# Patient Record
Sex: Female | Born: 1970 | Race: White | Hispanic: No | Marital: Married | Smoking: Never smoker
Health system: Southern US, Community
[De-identification: ages and names within clinical notes are randomized; demographics above are authoritative.]

## PROBLEM LIST (undated history)

## (undated) DIAGNOSIS — M069 Rheumatoid arthritis, unspecified: Secondary | ICD-10-CM

## (undated) DIAGNOSIS — T7840XA Allergy, unspecified, initial encounter: Secondary | ICD-10-CM

## (undated) DIAGNOSIS — K5190 Ulcerative colitis, unspecified, without complications: Secondary | ICD-10-CM

## (undated) DIAGNOSIS — M797 Fibromyalgia: Secondary | ICD-10-CM

## (undated) DIAGNOSIS — I10 Essential (primary) hypertension: Secondary | ICD-10-CM

## (undated) DIAGNOSIS — J45909 Unspecified asthma, uncomplicated: Secondary | ICD-10-CM

## (undated) DIAGNOSIS — F4310 Post-traumatic stress disorder, unspecified: Secondary | ICD-10-CM

## (undated) HISTORY — DX: Essential (primary) hypertension: I10

## (undated) HISTORY — DX: Post-traumatic stress disorder, unspecified: F43.10

## (undated) HISTORY — PX: TONSILLECTOMY: SUR1361

## (undated) HISTORY — DX: Ulcerative colitis, unspecified, without complications: K51.90

## (undated) HISTORY — DX: Allergy, unspecified, initial encounter: T78.40XA

## (undated) HISTORY — DX: Rheumatoid arthritis, unspecified: M06.9

## (undated) HISTORY — DX: Fibromyalgia: M79.7

## (undated) HISTORY — DX: Unspecified asthma, uncomplicated: J45.909

---

## 2014-01-30 DIAGNOSIS — R011 Cardiac murmur, unspecified: Secondary | ICD-10-CM | POA: Insufficient documentation

## 2014-01-30 DIAGNOSIS — M069 Rheumatoid arthritis, unspecified: Secondary | ICD-10-CM | POA: Insufficient documentation

## 2014-01-30 DIAGNOSIS — Z9109 Other allergy status, other than to drugs and biological substances: Secondary | ICD-10-CM | POA: Insufficient documentation

## 2014-01-30 DIAGNOSIS — Z8659 Personal history of other mental and behavioral disorders: Secondary | ICD-10-CM | POA: Insufficient documentation

## 2014-01-30 DIAGNOSIS — M797 Fibromyalgia: Secondary | ICD-10-CM | POA: Insufficient documentation

## 2014-04-23 DIAGNOSIS — E559 Vitamin D deficiency, unspecified: Secondary | ICD-10-CM | POA: Insufficient documentation

## 2014-04-27 DIAGNOSIS — M7918 Myalgia, other site: Secondary | ICD-10-CM | POA: Insufficient documentation

## 2014-04-27 DIAGNOSIS — M5417 Radiculopathy, lumbosacral region: Secondary | ICD-10-CM | POA: Insufficient documentation

## 2014-04-27 DIAGNOSIS — Z5181 Encounter for therapeutic drug level monitoring: Secondary | ICD-10-CM | POA: Insufficient documentation

## 2014-04-27 DIAGNOSIS — G894 Chronic pain syndrome: Secondary | ICD-10-CM | POA: Insufficient documentation

## 2015-04-29 DIAGNOSIS — I10 Essential (primary) hypertension: Secondary | ICD-10-CM | POA: Insufficient documentation

## 2016-04-13 DIAGNOSIS — G43009 Migraine without aura, not intractable, without status migrainosus: Secondary | ICD-10-CM | POA: Insufficient documentation

## 2016-05-11 DIAGNOSIS — F339 Major depressive disorder, recurrent, unspecified: Secondary | ICD-10-CM | POA: Insufficient documentation

## 2017-04-30 DIAGNOSIS — F410 Panic disorder [episodic paroxysmal anxiety] without agoraphobia: Secondary | ICD-10-CM | POA: Insufficient documentation

## 2017-05-24 DIAGNOSIS — M170 Bilateral primary osteoarthritis of knee: Secondary | ICD-10-CM | POA: Insufficient documentation

## 2017-12-19 DIAGNOSIS — M19012 Primary osteoarthritis, left shoulder: Secondary | ICD-10-CM | POA: Insufficient documentation

## 2018-11-25 DIAGNOSIS — K5190 Ulcerative colitis, unspecified, without complications: Secondary | ICD-10-CM | POA: Insufficient documentation

## 2019-10-08 ENCOUNTER — Other Ambulatory Visit: Payer: Self-pay

## 2019-10-09 ENCOUNTER — Encounter: Payer: Managed Care, Other (non HMO) | Admitting: Obstetrics & Gynecology

## 2019-10-14 ENCOUNTER — Encounter: Payer: Managed Care, Other (non HMO) | Admitting: Obstetrics and Gynecology

## 2019-10-31 ENCOUNTER — Other Ambulatory Visit (HOSPITAL_COMMUNITY)
Admission: RE | Admit: 2019-10-31 | Discharge: 2019-10-31 | Disposition: A | Discharge status: Home / Self Care | Payer: Managed Care, Other (non HMO) | Source: Ambulatory Visit | Attending: Obstetrics & Gynecology | Admitting: Obstetrics & Gynecology

## 2019-10-31 ENCOUNTER — Other Ambulatory Visit: Payer: Self-pay

## 2019-10-31 ENCOUNTER — Encounter: Payer: Self-pay | Admitting: Obstetrics and Gynecology

## 2019-10-31 ENCOUNTER — Ambulatory Visit (INDEPENDENT_AMBULATORY_CARE_PROVIDER_SITE_OTHER): Payer: Managed Care, Other (non HMO) | Admitting: Obstetrics and Gynecology

## 2019-10-31 ENCOUNTER — Encounter: Payer: Managed Care, Other (non HMO) | Admitting: Obstetrics and Gynecology

## 2019-10-31 VITALS — BP 128/80 | HR 68 | Temp 97.3°F | Ht 62.0 in | Wt 294.4 lb

## 2019-10-31 DIAGNOSIS — Z124 Encounter for screening for malignant neoplasm of cervix: Secondary | ICD-10-CM | POA: Insufficient documentation

## 2019-10-31 DIAGNOSIS — N920 Excessive and frequent menstruation with regular cycle: Secondary | ICD-10-CM

## 2019-10-31 DIAGNOSIS — R6882 Decreased libido: Secondary | ICD-10-CM | POA: Diagnosis not present

## 2019-10-31 DIAGNOSIS — IMO0002 Reserved for concepts with insufficient information to code with codable children: Secondary | ICD-10-CM

## 2019-10-31 DIAGNOSIS — F5231 Female orgasmic disorder: Secondary | ICD-10-CM | POA: Diagnosis not present

## 2019-10-31 DIAGNOSIS — Z01419 Encounter for gynecological examination (general) (routine) without abnormal findings: Secondary | ICD-10-CM

## 2019-10-31 DIAGNOSIS — Z62810 Personal history of physical and sexual abuse in childhood: Secondary | ICD-10-CM

## 2019-10-31 MED ORDER — SILDENAFIL CITRATE 50 MG PO TABS: ORAL_TABLET | ORAL | 0 refills | Status: DC
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
## 2019-10-31 NOTE — Addendum Note (Signed)
Addended by: Dorothy Spark on: 10/31/2019 05:08 PM   Modules accepted: Orders

## 2019-10-31 NOTE — Patient Instructions (Signed)
EXERCISE AND DIET:  We recommended that you start or continue a regular exercise program for good health. Regular exercise means any activity that makes your heart beat faster and makes you sweat.  We recommend exercising at least 30 minutes per day at least 3 days a week, preferably 4 or 5.  We also recommend a diet low in fat and sugar.  Inactivity, poor dietary choices and obesity can cause diabetes, heart attack, stroke, and kidney damage, among others.    ALCOHOL AND SMOKING:  Women should limit their alcohol intake to no more than 7 drinks/beers/glasses of wine (combined, not each!) per week. Moderation of alcohol intake to this level decreases your risk of breast cancer and liver damage. And of course, no recreational drugs are part of a healthy lifestyle.  And absolutely no smoking or even second hand smoke. Most people know smoking can cause heart and lung diseases, but did you know it also contributes to weakening of your bones? Aging of your skin?  Yellowing of your teeth and nails?  CALCIUM AND VITAMIN D:  Adequate intake of calcium and Vitamin D are recommended.  The recommendations for exact amounts of these supplements seem to change often, but generally speaking 1,000 mg of calcium (between diet and supplement) and 800 units of Vitamin D per day seems prudent. Certain women may benefit from higher intake of Vitamin D.  If you are among these women, your doctor will have told you during your visit.    PAP SMEARS:  Pap smears, to check for cervical cancer or precancers,  have traditionally been done yearly, although recent scientific advances have shown that most women can have pap smears less often.  However, every woman still should have a physical exam from her gynecologist every year. It will include a breast check, inspection of the vulva and vagina to check for abnormal growths or skin changes, a visual exam of the cervix, and then an exam to evaluate the size and shape of the uterus and  ovaries.  And after 47 years of age, a rectal exam is indicated to check for rectal cancers. We will also provide age appropriate advice regarding health maintenance, like when you should have certain vaccines, screening for sexually transmitted diseases, bone density testing, colonoscopy, mammograms, etc.   MAMMOGRAMS:  All women over 19 years old should have a yearly mammogram. Many facilities now offer a "3D" mammogram, which may cost around $50 extra out of pocket. If possible,  we recommend you accept the option to have the 3D mammogram performed.  It both reduces the number of women who will be called back for extra views which then turn out to be normal, and it is better than the routine mammogram at detecting truly abnormal areas.    COLON CANCER SCREENING: Now recommend starting at age 47. At this time colonoscopy is not covered for routine screening until 50. There are take home tests that can be done between 45-49.   COLONOSCOPY:  Colonoscopy to screen for colon cancer is recommended for all women at age 65.  We know, you hate the idea of the prep.  We agree, BUT, having colon cancer and not knowing it is worse!!  Colon cancer so often starts as a polyp that can be seen and removed at colonscopy, which can quite literally save your life!  And if your first colonoscopy is normal and you have no family history of colon cancer, most women don't have to have it again for  10 years.  Once every ten years, you can do something that may end up saving your life, right?  We will be happy to help you get it scheduled when you are ready.  Be sure to check your insurance coverage so you understand how much it will cost.  It may be covered as a preventative service at no cost, but you should check your particular policy.      Breast Self-Awareness Breast self-awareness means being familiar with how your breasts look and feel. It involves checking your breasts regularly and reporting any changes to your  health care provider. Practicing breast self-awareness is important. A change in your breasts can be a sign of a serious medical problem. Being familiar with how your breasts look and feel allows you to find any problems early, when treatment is more likely to be successful. All women should practice breast self-awareness, including women who have had breast implants. How to do a breast self-exam One way to learn what is normal for your breasts and whether your breasts are changing is to do a breast self-exam. To do a breast self-exam: Look for Changes  1. Remove all the clothing above your waist. 2. Stand in front of a mirror in a room with good lighting. 3. Put your hands on your hips. 4. Push your hands firmly downward. 5. Compare your breasts in the mirror. Look for differences between them (asymmetry), such as: ? Differences in shape. ? Differences in size. ? Puckers, dips, and bumps in one breast and not the other. 6. Look at each breast for changes in your skin, such as: ? Redness. ? Scaly areas. 7. Look for changes in your nipples, such as: ? Discharge. ? Bleeding. ? Dimpling. ? Redness. ? A change in position. Feel for Changes Carefully feel your breasts for lumps and changes. It is best to do this while lying on your back on the floor and again while sitting or standing in the shower or tub with soapy water on your skin. Feel each breast in the following way:  Place the arm on the side of the breast you are examining above your head.  Feel your breast with the other hand.  Start in the nipple area and make  inch (2 cm) overlapping circles to feel your breast. Use the pads of your three middle fingers to do this. Apply light pressure, then medium pressure, then firm pressure. The light pressure will allow you to feel the tissue closest to the skin. The medium pressure will allow you to feel the tissue that is a little deeper. The firm pressure will allow you to feel the tissue  close to the ribs.  Continue the overlapping circles, moving downward over the breast until you feel your ribs below your breast.  Move one finger-width toward the center of the body. Continue to use the  inch (2 cm) overlapping circles to feel your breast as you move slowly up toward your collarbone.  Continue the up and down exam using all three pressures until you reach your armpit.  Write Down What You Find  Write down what is normal for each breast and any changes that you find. Keep a written record with breast changes or normal findings for each breast. By writing this information down, you do not need to depend only on memory for size, tenderness, or location. Write down where you are in your menstrual cycle, if you are still menstruating. If you are having trouble noticing differences   in your breasts, do not get discouraged. With time you will become more familiar with the variations in your breasts and more comfortable with the exam. How often should I examine my breasts? Examine your breasts every month. If you are breastfeeding, the best time to examine your breasts is after a feeding or after using a breast pump. If you menstruate, the best time to examine your breasts is 5-7 days after your period is over. During your period, your breasts are lumpier, and it may be more difficult to notice changes. When should I see my health care provider? See your health care provider if you notice:  A change in shape or size of your breasts or nipples.  A change in the skin of your breast or nipples, such as a reddened or scaly area.  Unusual discharge from your nipples.  A lump or thick area that was not there before.  Pain in your breasts.  Anything that concerns you.  

## 2019-11-04 LAB — CYTOLOGY - PAP
Comment: NEGATIVE
Diagnosis: NEGATIVE
High risk HPV: NEGATIVE

## 2019-11-05 ENCOUNTER — Telehealth: Payer: Self-pay | Admitting: Obstetrics and Gynecology

## 2019-11-05 NOTE — Telephone Encounter (Signed)
Spoke with patient in regards to benefit for recommended ultrasound. Patient acknowledges understanding of information presented. Patient is scheduled 11/18/2019 with Dr Talbert Nan. Patient is aware of the appointment date arrival time and cancellation policy.  Patient states her spouse was diagnosed with covid on 11/03/2019. Forwarding to Triage to review quarantine guidelines with appointment date  Forwarding to Triage Nurse

## 2019-11-06 LAB — TESTT+TESTF+SHBG
Sex Hormone Binding: 48.9 nmol/L (ref 24.6–122.0)
Testosterone, Free: 1.6 pg/mL (ref 0.0–4.2)
Testosterone, Total, LC/MS: 21.5 ng/dL

## 2019-11-07 NOTE — Telephone Encounter (Signed)
Left message to call Delson Dulworth, RN at [facility] [phone].   

## 2019-11-10 ENCOUNTER — Telehealth: Payer: Self-pay | Admitting: Obstetrics and Gynecology

## 2019-11-10 ENCOUNTER — Encounter: Payer: Self-pay | Admitting: Obstetrics and Gynecology

## 2019-11-10 DIAGNOSIS — IMO0002 Reserved for concepts with insufficient information to code with codable children: Secondary | ICD-10-CM

## 2019-11-10 DIAGNOSIS — F5231 Female orgasmic disorder: Secondary | ICD-10-CM

## 2019-11-10 MED ORDER — TERCONAZOLE 0.4 % VA CREA: 1.0000 | TOPICAL_CREAM | Freq: Every day | VAGINAL | 0 refills | Status: DC

## 2019-11-10 NOTE — Telephone Encounter (Signed)
Because of the warning of allergy to [facility] when prescribing diflucan, I've called in Terazol. I've sent her a [employer].

## 2019-11-10 NOTE — Telephone Encounter (Signed)
Spoke with patient.  10/31/19 pap positive for yeast, asymptomatic at the time, no Rx sent.   Patient reports vaginal itching and "beige" vaginal d/c for the past 2 days. Denies any other GYN symptoms. Patient request Rx. Confirmed pharmacy on file.   Patient has document allergy to [facility], contraindicated with diflucan.    Patients spouse is Covid19 positive, she is waiting for testing. Asking of any contraindications for tx yeast and Covid19?     Dr. Talbert Nan -please review and advise on RX.

## 2019-11-10 NOTE — Telephone Encounter (Signed)
Left message to call Sharee Pimple, RN at [place].    Patient was seen in office on 10/31/19 for AEX, pap positive for yeast. Patient to call for Rx if symptomatic.

## 2019-11-10 NOTE — Telephone Encounter (Signed)
Patient sent the following message through [facility]. Routing to triage to assist patient with request.  Tara Owen [facility]  Phone Number: [phone]  I would like an oral treatment for yeast infection please. My husband tested positive for Covid and I am having symptoms as well.  Will it be ok to take medicine for yeast infection if I am Covid positive?  Thanks,  Tara Owen

## 2019-11-11 NOTE — Telephone Encounter (Signed)
See telephone encounter dated 11/10/19.  Will close this encounter.

## 2019-11-11 NOTE — Telephone Encounter (Signed)
Spoke to pt. Pt called to reschedule PUS with possible EMB per Dr Talbert Nan from 11/18/2019 to 12/09/2019 at 3pm d/t +COVID. Pt agreeable.   Will route to Dr Talbert Nan for final review and will close encounter.

## 2019-11-11 NOTE — Telephone Encounter (Signed)
Please call the patient, in my message to her I meant to say that diflucan is contraindicated in people with allergies to emetrol (not who are on it). That's why I prescribed the terazol.

## 2019-11-11 NOTE — Telephone Encounter (Signed)
MyChart message not read.   Call to patient, Left message to call Sharee Pimple, RN at [place].

## 2019-11-11 NOTE — Telephone Encounter (Addendum)
Also see open telephone encounter dated 11/05/19 regarding PUS scheduled for 11/18/19.   Tara Owen routed conversation to [employer] 6 days ago  Tara Owen 6 days ago  [place] with patient in regards to benefit for recommended ultrasound. Patient acknowledges understanding of information presented. Patient is scheduled 11/18/2019 with Dr Talbert Nan. Patient is aware of the appointment date arrival time and cancellation policy.  Patient states her spouse was diagnosed with covid on 11/03/2019. Forwarding to Triage to review quarantine guidelines with appointment date  Forwarding to Triage Nurse

## 2019-11-11 NOTE — Telephone Encounter (Signed)
Spoke to pt. Pt given clarification on diflucan. Pt states has taken diflucan in recent past and did well with no allergy reaction. Had allergy 26 years ago while pregnant. Pt prefers pill to cream due to arthritis in hands and difficult to insert.   Will route to Dr Talbert Nan for review and additional recommendations.

## 2019-11-12 MED ORDER — FLUCONAZOLE 150 MG PO TABS: 150.0000 mg | ORAL_TABLET | Freq: Once | ORAL | 0 refills | Status: AC

## 2019-11-12 NOTE — Addendum Note (Signed)
Addended by: Dorothy Spark on: 11/12/2019 03:48 PM   Modules accepted: Orders

## 2019-11-12 NOTE — Telephone Encounter (Signed)
Diflucan sent

## 2019-11-12 NOTE — Telephone Encounter (Signed)
Call to patient, left message. Advised requested RX has been sent to [facility] in [place]. Return call to office if any additional questions.

## 2019-11-18 ENCOUNTER — Other Ambulatory Visit: Payer: Managed Care, Other (non HMO)

## 2019-11-18 ENCOUNTER — Other Ambulatory Visit: Payer: Managed Care, Other (non HMO) | Admitting: Obstetrics and Gynecology

## 2019-12-02 ENCOUNTER — Other Ambulatory Visit: Payer: Self-pay | Admitting: *Deleted

## 2019-12-02 DIAGNOSIS — N920 Excessive and frequent menstruation with regular cycle: Secondary | ICD-10-CM

## 2019-12-04 ENCOUNTER — Telehealth: Payer: Self-pay | Admitting: Obstetrics and Gynecology

## 2019-12-04 NOTE — Telephone Encounter (Signed)
Patient returned call. Reviewed benefit for recommended ultrasound. Patient requested to cancel appointment and declined to rescheduled at this time.  Forwarding to Dr Oscar La  cc: Nolen Mu, RN        Billie Ruddy, RN

## 2019-12-04 NOTE — Telephone Encounter (Signed)
Spoke with patient. Appointment for PUS rescheduled for 12/09/2019 at 3 pm with 3:30 pm consult with Dr.Jertson. patient is agreeable to date and time. Would like to speak with Thomasene Lot again regarding benefits.   Routing to [employer] and [employer]

## 2019-12-04 NOTE — Telephone Encounter (Signed)
Please let the patient know the ultrasound it to try and help understand why she is bleeding so heavily to the point of causing anemia (not currently anemic, but has had iron transfusions). If I understand the cause of her heavy bleeding, I will be better able to help her with it. There are many potential treatments, once we've ruled out pathology

## 2019-12-04 NOTE — Telephone Encounter (Signed)
Call placed to preview benefit for scheduled ultrasound appointment with Dr Oscar La on 12/09/2019. Left voicemail message requesting a return call

## 2019-12-04 NOTE — Telephone Encounter (Signed)
Spoke with patient. Reviewed message from Dr.Jertson. Patient would like to contact other ultrasound locations and return call on how she would like to proceed.

## 2019-12-04 NOTE — Telephone Encounter (Signed)
Patient would like to talk with [facility] again. She decided she would like to reschedule.

## 2019-12-08 NOTE — Telephone Encounter (Signed)
See previous phone notes. Call placed to patient to review benefits, as previously discussed on 12/04/2019. Left voicemail message requesting a return call.   cc: Billie Ruddy, RN        Nolen Mu, RN

## 2019-12-09 ENCOUNTER — Other Ambulatory Visit: Payer: Managed Care, Other (non HMO)

## 2019-12-09 ENCOUNTER — Other Ambulatory Visit: Payer: Self-pay | Admitting: Obstetrics and Gynecology

## 2019-12-09 ENCOUNTER — Encounter: Payer: Self-pay | Admitting: Obstetrics and Gynecology

## 2019-12-09 ENCOUNTER — Other Ambulatory Visit (HOSPITAL_COMMUNITY)
Admission: RE | Admit: 2019-12-09 | Discharge: 2019-12-09 | Disposition: A | Discharge status: Home / Self Care | Payer: Managed Care, Other (non HMO) | Source: Ambulatory Visit | Attending: Obstetrics and Gynecology | Admitting: Obstetrics and Gynecology

## 2019-12-09 ENCOUNTER — Other Ambulatory Visit: Payer: Self-pay

## 2019-12-09 ENCOUNTER — Ambulatory Visit (INDEPENDENT_AMBULATORY_CARE_PROVIDER_SITE_OTHER): Payer: Managed Care, Other (non HMO) | Admitting: Obstetrics and Gynecology

## 2019-12-09 ENCOUNTER — Ambulatory Visit (INDEPENDENT_AMBULATORY_CARE_PROVIDER_SITE_OTHER): Payer: Managed Care, Other (non HMO)

## 2019-12-09 ENCOUNTER — Other Ambulatory Visit: Payer: Managed Care, Other (non HMO) | Admitting: Obstetrics and Gynecology

## 2019-12-09 VITALS — BP 130/64 | HR 67 | Temp 98.3°F | Ht 62.0 in | Wt 251.0 lb

## 2019-12-09 DIAGNOSIS — N920 Excessive and frequent menstruation with regular cycle: Secondary | ICD-10-CM | POA: Insufficient documentation

## 2019-12-09 DIAGNOSIS — N926 Irregular menstruation, unspecified: Secondary | ICD-10-CM | POA: Diagnosis not present

## 2019-12-09 DIAGNOSIS — R9389 Abnormal findings on diagnostic imaging of other specified body structures: Secondary | ICD-10-CM | POA: Insufficient documentation

## 2019-12-09 MED ORDER — MEDROXYPROGESTERONE ACETATE 5 MG PO TABS: 5.0000 mg | ORAL_TABLET | Freq: Every day | ORAL | 0 refills | Status: DC

## 2019-12-09 MED ORDER — NORETHINDRONE 0.35 MG PO TABS: 1.0000 | ORAL_TABLET | Freq: Every day | ORAL | 0 refills | Status: DC

## 2019-12-09 NOTE — Progress Notes (Signed)
GYNECOLOGY  VISIT   HPI: 49 y.o.   Married White or Caucasian Not Hispanic or Latino  female   [phone] with LMP 10/31/19   here for   Pelvic ultrasound for menorrhagia. She can saturate a pad in up to 20 minutes. She was recently not anemic, but has needed iron transfusions in the past. Hgb from 11/20 was 12.8.   GYNECOLOGIC HISTORY: No LMP recorded. Contraception vasectomy  Menopausal hormone therapy: none        OB History    Gravida  3   Para  3   Term  3   Preterm      AB      Living  3     SAB      TAB      Ectopic      Multiple      Live Births  2              Patient Active Problem List   Diagnosis Date Noted  . Ulcerative colitis without complications ([place]) 17/61/6073  . Glenohumeral arthritis, left 12/19/2017  . Primary osteoarthritis of both knees 05/24/2017  . Panic disorder 04/30/2017  . Morbid obesity ([place]) 04/30/2017  . Major depression, recurrent ([place]) 05/11/2016  . Migraine without aura and without status migrainosus, not intractable 04/13/2016  . Essential hypertension with goal blood pressure less than 140/90 04/29/2015  . Radiculopathy, lumbosacral region 04/27/2014  . Myofascial pain 04/27/2014  . Vitamin D deficiency 04/23/2014  . Fibromyalgia 01/30/2014  . Environmental allergies 01/30/2014  . Heart murmur 01/30/2014  . History of posttraumatic stress disorder (PTSD) 01/30/2014  . Rheumatoid arthritis ([place]) 01/30/2014    Past Medical History:  Diagnosis Date  . Allergies   . Fibromyalgia   . Hypertension   . PTSD (post-traumatic stress disorder)   . Rheumatoid arthritis ([place])   . Ulcerative colitis [facility])     Past Surgical History:  Procedure Laterality Date  . CESAREAN SECTION     2  . TONSILLECTOMY      Current Outpatient Medications  Medication Sig Dispense Refill  . busPIRone (BUSPAR) 10 MG tablet Take 1 tablet by mouth as needed.    . DULoxetine (CYMBALTA) 60 MG capsule Take 60 mg by mouth every morning.    .  mesalamine (LIALDA) 1.2 g EC tablet Take 1 tablet by mouth daily. Take 4 tablets daily.    . montelukast (SINGULAIR) 10 MG tablet Take 10 mg by mouth daily.    . propranolol (INDERAL) 40 MG tablet Take 40 mg by mouth 2 (two) times daily.    . sildenafil (VIAGRA) 50 MG tablet Take one tablet po 1 hour prior to sex 10 tablet 0  . terconazole (TERAZOL 7) 0.4 % vaginal cream Place 1 applicator vaginally at bedtime. One applicator full QHS for seven days of therapy 45 g 0  . traZODone (DESYREL) 50 MG tablet Take 50 mg by mouth at bedtime.    Morrie Sheldon 5 MG TABS Take 1 tablet by mouth 2 (two) times daily.     No current facility-administered medications for this visit.     ALLERGIES: Codeine, Iodinated diagnostic agents, and Emetrol  Family History  Problem Relation Age of Onset  . Polycystic kidney disease Mother   . Hypertension Mother   . Hypertension Maternal Grandmother   . Hypertension Maternal Grandfather     Social History   Socioeconomic History  . Marital status: Married    Spouse name: Not on file  .  Number of children: Not on file  . Years of education: Not on file  . Highest education level: Not on file  Occupational History  . Not on file  Tobacco Use  . Smoking status: Never Smoker  . Smokeless tobacco: Never Used  Substance and Sexual Activity  . Alcohol use: Yes    Comment: socially  . Drug use: Never  . Sexual activity: Yes    Birth control/protection: Surgical    Comment: vasectomy  Other Topics Concern  . Not on file  Social History Narrative  . Not on file   Social Determinants of Health   Financial Resource Strain:   . Difficulty of Paying Living Expenses: Not on file  Food Insecurity:   . Worried About Programme researcher, broadcasting/film/video in the Last Year: Not on file  . Ran Out of Food in the Last Year: Not on file  Transportation Needs:   . Lack of Transportation (Medical): Not on file  . Lack of Transportation (Non-Medical): Not on file  Physical Activity:     . Days of Exercise per Week: Not on file  . Minutes of Exercise per Session: Not on file  Stress:   . Feeling of Stress : Not on file  Social Connections:   . Frequency of Communication with Friends and Family: Not on file  . Frequency of Social Gatherings with Friends and Family: Not on file  . Attends Religious Services: Not on file  . Active Member of Clubs or Organizations: Not on file  . Attends Banker Meetings: Not on file  . Marital Status: Not on file  Intimate Partner Violence:   . Fear of Current or Ex-Partner: Not on file  . Emotionally Abused: Not on file  . Physically Abused: Not on file  . Sexually Abused: Not on file    Review of Systems  All other systems reviewed and are negative.   PHYSICAL EXAMINATION:    BP 130/64   Pulse 67   Temp 98.3 F (36.8 C)   Ht 5\' 2"  (1.575 m)   Wt 251 lb (113.9 kg)   SpO2 95%   BMI 45.91 kg/m     General appearance: alert, cooperative and appears stated age  Pelvic: External genitalia:  no lesions              Urethra:  normal appearing urethra with no masses, tenderness or lesions              Bartholins and Skenes: normal                 Vagina: normal appearing vagina with normal color and discharge, no lesions              Cervix: without lesions, high up in her vagina.   The risks of endometrial biopsy were reviewed and a consent was obtained.  A speculum was placed in the vagina and the cervix was cleansed with betadine. A tenaculum was placed on the cervix and the pipelle was placed into the endometrial cavity. The uterus sounded to 11 cm. The endometrial biopsy was performed, moderate tissue was obtained. The tenaculum and speculum were removed. There were no complications.    Chaperone was present for exam.  ASSESSMENT Menorrhagia Late menses (due a few weeks ago, not typical for her). Not currently sexually active, no chance for pregnancy Thickened endometrium    PLAN Endometrial  biopsy Provera 5 mg x 5 days Discussed the option of trying the  minipill or the mirnea IUD to help with her menorrhagia. She would like to try the minipill Start the minipill on the first day of her cycle Calendar bleeding F/U in 3 months   An After Visit Summary was printed and given to the patient.

## 2019-12-09 NOTE — Patient Instructions (Signed)
Endometrial Biopsy Post-procedure Instructions . Cramping is common.  You may take Ibuprofen, Aleve, or Tylenol for the cramping.  This should resolve within 24 hours.   . You may have a small amount of spotting.  You should wear a mini pad for the next few days. . You may have intercourse in 24 hours. . You need to call the office if you have any pelvic pain, fever, heavy bleeding, or foul smelling vaginal discharge. . Shower or bathe as normal . You will be notified within one week of your biopsy results or we will discuss your results at your follow-up appointment if needed.   Norethindrone tablets (contraception) What is this medicine? NORETHINDRONE (nor eth IN drone) is an oral contraceptive. The product contains a female hormone known as a progestin. It is used to prevent pregnancy. This medicine may be used for other purposes; ask your health care provider or pharmacist if you have questions. COMMON BRAND NAME(S): Camila, Deblitane 28-Day, [place]in, Heather, [place], Jolivette, [place], Nor-QD, Nora-BE, Norlyroc, Ortho Micronor, [employer] 28-Day What should I tell my health care provider before I take this medicine? They need to know if you have any of these conditions:  blood vessel disease or blood clots  breast, cervical, or vaginal cancer  diabetes  heart disease  kidney disease  liver disease  mental depression  migraine  seizures  stroke  vaginal bleeding  an unusual or allergic reaction to norethindrone, other medicines, foods, dyes, or preservatives  pregnant or trying to get pregnant  breast-feeding How should I use this medicine? Take this medicine by mouth with a glass of water. You may take it with or without food. Follow the directions on the prescription label. Take this medicine at the same time each day and in the order directed on the package. Do not take your medicine more often than directed. Contact your pediatrician regarding the use of this  medicine in children. Special care may be needed. This medicine has been used in female children who have started having menstrual periods. A patient package insert for the product will be given with each prescription and refill. Read this sheet carefully each time. The sheet may change frequently. Overdosage: If you think you have taken too much of this medicine contact a poison control center or emergency room at once. NOTE: This medicine is only for you. Do not share this medicine with others. What if I miss a dose? Try not to miss a dose. Every time you miss a dose or take a dose late your chance of pregnancy increases. When 1 pill is missed (even if only 3 hours late), take the missed pill as soon as possible and continue taking a pill each day at the regular time (use a back up method of birth control for the next 48 hours). If more than 1 dose is missed, use an additional birth control method for the rest of your pill pack until menses occurs. Contact your health care professional if more than 1 dose has been missed. What may interact with this medicine? Do not take this medicine with any of the following medications:  amprenavir or fosamprenavir  bosentan This medicine may also interact with the following medications:  antibiotics or medicines for infections, especially rifampin, rifabutin, rifapentine, and griseofulvin, and possibly penicillins or tetracyclines  aprepitant  barbiturate medicines, such as phenobarbital  carbamazepine  felbamate  modafinil  oxcarbazepine  phenytoin  ritonavir or other medicines for HIV infection or AIDS  St. John's wort  topiramate This list may not describe all possible interactions. Give your health care provider a list of all the medicines, herbs, non-prescription drugs, or dietary supplements you use. Also tell them if you smoke, drink alcohol, or use illegal drugs. Some items may interact with your medicine. What should I watch for  while using this medicine? Visit your doctor or health care professional for regular checks on your progress. You will need a regular breast and pelvic exam and Pap smear while on this medicine. Use an additional method of birth control during the first cycle that you take these tablets. If you have any reason to think you are pregnant, stop taking this medicine right away and contact your doctor or health care professional. If you are taking this medicine for hormone related problems, it may take several cycles of use to see improvement in your condition. This medicine does not protect you against HIV infection (AIDS) or any other sexually transmitted diseases. What side effects may I notice from receiving this medicine? Side effects that you should report to your doctor or health care professional as soon as possible:  breast tenderness or discharge  pain in the abdomen, chest, groin or leg  severe headache  skin rash, itching, or hives  sudden shortness of breath  unusually weak or tired  vision or speech problems  yellowing of skin or eyes Side effects that usually do not require medical attention (report to your doctor or health care professional if they continue or are bothersome):  changes in sexual desire  change in menstrual flow  facial hair growth  fluid retention and swelling  headache  irritability  nausea  weight gain or loss This list may not describe all possible side effects. Call your doctor for medical advice about side effects. You may report side effects to FDA at 1-800-FDA-1088. Where should I keep my medicine? Keep out of the reach of children. Store at room temperature between 15 and 30 degrees C (59 and 86 degrees F). Throw away any unused medicine after the expiration date. NOTE: This sheet is a summary. It may not cover all possible information. If you have questions about this medicine, talk to your doctor, pharmacist, or health care  provider.  2020 Elsevier/Gold Standard (2012-07-26 16:41:35)

## 2019-12-11 ENCOUNTER — Telehealth: Payer: Self-pay | Admitting: Obstetrics and Gynecology

## 2019-12-11 DIAGNOSIS — R9389 Abnormal findings on diagnostic imaging of other specified body structures: Secondary | ICD-10-CM

## 2019-12-11 DIAGNOSIS — N840 Polyp of corpus uteri: Secondary | ICD-10-CM

## 2019-12-11 DIAGNOSIS — N920 Excessive and frequent menstruation with regular cycle: Secondary | ICD-10-CM

## 2019-12-11 LAB — SURGICAL PATHOLOGY

## 2019-12-11 NOTE — Telephone Encounter (Signed)
Patient calling to get clarification on how to take medication.

## 2019-12-11 NOTE — Telephone Encounter (Signed)
Spoke with patient. Patient was seen in office on 12/09/19 for menorrhagia nd late menses, PUS and EMB completed. Rx given for Provera 5mg  po x5 days, start minipill on the first day of cycle.   She started Provera on 1/19, reports some spotting since her OV, asking if she should continue provera or start POP?   Advised patient to complete Provera and continue to monitor bleeding. May be experiencing some spotting from EMB or can be the start of menses. Advised it can take up to 2 wks after completing provera for menses to start. Patient asking if bleeding does not increase more than spotting ok to start POP when provera complete? Advised I will review with Dr. 2/19 and return call.   Dr. Oscar La -please advise.

## 2019-12-11 NOTE — Telephone Encounter (Signed)
Her endometrium is thickened, she should have a real cycle, likely heavy. I would wait until her cycle starts to take the mini-pill. Please also let her know that her biopsy returned as benign, but with an endometrial polyp. There was no obvious polyp on her recent ultrasound. I would recommend that she return for a repeat ultrasound, possible sonohysterogram after she completes her next cycle to make sure we aren't missing a residual polyp.

## 2019-12-11 NOTE — Telephone Encounter (Signed)
Left message to call Ruhaan Nordahl, RN at [facility] [phone].   

## 2019-12-12 NOTE — Telephone Encounter (Signed)
Spoke with patient, advised per Dr. Oscar La. Order placed for PUS, possible SHGM, for precert. Patient will return call once she completes next menses to schedule imaging. Questions answered.    Routing to provider for final review. Patient is agreeable to disposition. Will close encounter.  Cc: Soundra Pilon, [address] [employer]

## 2019-12-15 ENCOUNTER — Encounter: Payer: Self-pay | Admitting: Obstetrics and Gynecology

## 2019-12-15 ENCOUNTER — Telehealth: Payer: Self-pay | Admitting: Obstetrics and Gynecology

## 2019-12-15 NOTE — Telephone Encounter (Signed)
Patient sent the following message through MyChart. Routing to triage to assist patient with request.  Everardo Beals [facility]  Phone Number: [phone]  Hello,  Nurse Noreene Larsson said I was going to be scheduling another procedure after this cycle. I believe it was a hysterogram with contrast. I am allergic to MRI contrast, my throat swells and gets itchy. I wasn't sure if the same contrast ingredients are used in this procedure as well and if I would still be able to have this type of test?  Thanks,  Tara Owen

## 2019-12-15 NOTE — Telephone Encounter (Signed)
Spoke with patient. Advised patient a PUS, possible SHGM was recommended, no dye will be used, only saline, ok to proceed.   Patient reports menses started on 12/13/19. Patient is to schedule PUS, possible SHGM following menses. Patient request to review benefits prior to proceeding with scheduling. Advised will forward to business office for return call. Patient agreeable.    Routing to [employer], [employer]

## 2019-12-17 ENCOUNTER — Telehealth: Payer: Self-pay | Admitting: Obstetrics and Gynecology

## 2019-12-17 ENCOUNTER — Encounter: Payer: Self-pay | Admitting: Obstetrics and Gynecology

## 2019-12-17 NOTE — Telephone Encounter (Signed)
Patient sent the following correspondence through MyChart.  I have developed a very bad cough with itchy throat and ears.  I am wheezing and producing a lot of mucus/ post nasal drip.  I take singular daily, which doesn't help with these new symptoms.  I get some relief when I use my inhaler, and take benadryl.  A steam from the sink helps most.  I noticed these changes the first day I started Norlyda, it has progressed to the point I throw up from coughing so much and can't sleep well.  I haven't been around anyone sick.  I think I may be allergic to [facility]. Thanks, Tara Owen

## 2019-12-17 NOTE — Telephone Encounter (Signed)
Spoke with patient, advised as seen below per Dr. Jertson. Patient verbalizes understanding and is agreeable. Encounter closed.  

## 2019-12-17 NOTE — Telephone Encounter (Signed)
I agree, she should stop the medication until she is evaluated. This is not a typical reaction to the mini-pill. It could be that she is having a viral infection, that seems more likely. Have her give [place] an update after she is seen. She needs some treatment of her AUB.

## 2019-12-17 NOTE — Telephone Encounter (Signed)
Routing update to Dr. Oscar La. Please advise.

## 2019-12-17 NOTE — Telephone Encounter (Signed)
Spoke with patient. Patient Tara Owen on 12/13/19. Reports symptoms of cough,wheezing, itchy throat and ears started later that day. Symptoms have progressed since. Unable to sleep. Takes Singulair daily. Benadryl, inhaler, Dayquil, steam and Vicks vapor rub help provide symptom relief. Denies fever/chills, SOB, difficulty breathing, swelling or tightness.  Took last dose of Norlyda at 1pm on 1/26. Patient states she has tried oral birth control in the past and experienced similar symptoms.   Advised patient to seek immediate care with PCP/Urgent Care/ER for further evaluation of symptoms. Advised patient these are not common side effects of POP, should be evaluated by PCP/Urgent Care/ER to determine if any other causes. Recommended patient stop medication until she is evaluated. Advised I will review with Dr. Oscar La and return call if any additional recommendations. Patient verbalizes understanding and is agreeable.   Dr. Oscar La -please review.

## 2019-12-17 NOTE — Telephone Encounter (Signed)
Tara Owen [facility]  Phone Number: [phone]  I just was seen at my general practitioner which put me on steroids and an inhaler for an allergic reaction to [facility]. He instructed me to go to ER if I have further breathing difficulties. He also is referring me to an allergist.  How do I proceed with care in your office?  Thanks,  Tara Owen

## 2019-12-18 ENCOUNTER — Telehealth: Payer: Self-pay | Admitting: Obstetrics and Gynecology

## 2019-12-18 NOTE — Telephone Encounter (Signed)
Spoke with patient. Advised as seen below per Dr. Oscar La.    1. LMP 12/13/19. Patient request to proceed with scheduling SHGM. Scheduled for 12/23/19 at 2pm, consult to follow with Dr. Oscar La. Patient states she was unable to have transvaginal ultrasound completed due to her C/S scars, [place] was done abdominally. Patient states she was advised no transvaginal US in future. Advised patient I will review recommendations with Dr. Oscar La and return call.    2. Patient states she has an appt with an allergist on 2/2 at 5pm. Patient states she has allergies to a lot of additives in medications, would like to review options with allergist. Will plan to discuss with Dr. Oscar La further at [facility] if still needed.   Advised patient I will review with Dr. Oscar La and f/u.   Routing to Dr. Oscar La to review and advise.   Cc: Soundra Pilon & [place] [facility]

## 2019-12-18 NOTE — Telephone Encounter (Signed)
Please tell the patient not to worry. We can do the sonohysterogram with the abdominal probe. The fluid in the cavity enables me to see the outline of the cavity better.   I think it's a great idea for her to review options with her allergist.

## 2019-12-18 NOTE — Telephone Encounter (Signed)
Patient previously requested review of benefits prior to scheduling SHGM. Order previously placed. Routing to business office for return call.   Soundra Pilon and [employer]

## 2019-12-18 NOTE — Telephone Encounter (Signed)
She could get a mirena IUD, she could try depo-provera (but can have the side effect of weight gain), she could use Lysteda with her cycle to decrease the flow. Lysteda is usually well tolerated, can cause dizziness, headaches, small risk of forming blood clots (ie DVT). Other option would be surgery.

## 2019-12-18 NOTE — Telephone Encounter (Signed)
Patient calling regarding scheduling sonohysterogram. States she has been waiting to hear from our office.

## 2019-12-18 NOTE — Telephone Encounter (Signed)
Spoke with patient, advised per Dr. Jertson. Patient verbalizes understanding and is agreeable.   Encounter closed.  

## 2019-12-19 NOTE — Telephone Encounter (Signed)
See account notes. Encounter closed °

## 2019-12-19 NOTE — Telephone Encounter (Signed)
Spoke with the patient and conveyed the benefits fr SHGM. Patient understands/agreeable with the benefits. Patient is aware of the cancellation policy. Appointment scheduled 22/21.

## 2019-12-22 ENCOUNTER — Encounter: Payer: Self-pay | Admitting: Obstetrics and Gynecology

## 2019-12-22 NOTE — Telephone Encounter (Signed)
Dr. Oscar La -ok to proceed with [facility] as scheduled for 2/2?  LMP 12/13/19.

## 2019-12-22 NOTE — Telephone Encounter (Signed)
Yes, thank you.

## 2019-12-22 NOTE — Telephone Encounter (Signed)
Spoke with patient, advised per Dr. Oscar La. Patient will proceed with [facility] as scheduled.   Encounter closed.

## 2019-12-22 NOTE — Telephone Encounter (Signed)
Visit Follow-Up Question Received: Today Message Contents  Milinda Cave sent to [facility]  Phone Number: [phone]  I am scheduled for a sonohystogram tomorrow in your office. I am finishing up a cycle with very light spotting. Should I still have the test tomorrow or reschedule?  Thanks,  Esmond Harps

## 2019-12-23 ENCOUNTER — Other Ambulatory Visit: Payer: Self-pay

## 2019-12-23 ENCOUNTER — Other Ambulatory Visit: Payer: Self-pay | Admitting: Obstetrics and Gynecology

## 2019-12-23 ENCOUNTER — Ambulatory Visit (INDEPENDENT_AMBULATORY_CARE_PROVIDER_SITE_OTHER): Payer: Managed Care, Other (non HMO) | Admitting: Obstetrics and Gynecology

## 2019-12-23 ENCOUNTER — Encounter: Payer: Self-pay | Admitting: Obstetrics and Gynecology

## 2019-12-23 ENCOUNTER — Other Ambulatory Visit (INDEPENDENT_AMBULATORY_CARE_PROVIDER_SITE_OTHER): Payer: Managed Care, Other (non HMO)

## 2019-12-23 VITALS — BP 124/82 | HR 72 | Temp 99.0°F | Wt 253.4 lb

## 2019-12-23 DIAGNOSIS — R9389 Abnormal findings on diagnostic imaging of other specified body structures: Secondary | ICD-10-CM | POA: Diagnosis not present

## 2019-12-23 DIAGNOSIS — N920 Excessive and frequent menstruation with regular cycle: Secondary | ICD-10-CM

## 2019-12-23 DIAGNOSIS — Z862 Personal history of diseases of the blood and blood-forming organs and certain disorders involving the immune mechanism: Secondary | ICD-10-CM

## 2019-12-23 DIAGNOSIS — N840 Polyp of corpus uteri: Secondary | ICD-10-CM

## 2019-12-23 NOTE — Patient Instructions (Addendum)
Possible treatment options for your heavy cycles include: Mirena IUD Depo-provera Lysteda Cyclic or daily provera Hysterectomy  If you had a hysterectomy, typically you would go home on ibuprofen, tylenol, and oxycodone

## 2019-12-23 NOTE — Progress Notes (Signed)
GYNECOLOGY  VISIT   HPI: 49 y.o.   Married White or Caucasian Not Hispanic or Latino  female   [phone] with Patient's last menstrual period was 12/12/2018 (exact date).   here for consult following [facility]  GYNECOLOGIC HISTORY: Patient's last menstrual period was 12/12/2018 (exact date). Contraception: Spouse with vasectomy Menopausal hormone therapy: None        OB History    Gravida  3   Para  3   Term  3   Preterm      AB      Living  3     SAB      TAB      Ectopic      Multiple      Live Births  2              Patient Active Problem List   Diagnosis Date Noted  . Ulcerative colitis without complications (HCC) 11/25/2018  . Glenohumeral arthritis, left 12/19/2017  . Primary osteoarthritis of both knees 05/24/2017  . Panic disorder 04/30/2017  . Morbid obesity (HCC) 04/30/2017  . Major depression, recurrent (HCC) 05/11/2016  . Migraine without aura and without status migrainosus, not intractable 04/13/2016  . Essential hypertension with goal blood pressure less than 140/90 04/29/2015  . Radiculopathy, lumbosacral region 04/27/2014  . Myofascial pain 04/27/2014  . Vitamin D deficiency 04/23/2014  . Fibromyalgia 01/30/2014  . Environmental allergies 01/30/2014  . Heart murmur 01/30/2014  . History of posttraumatic stress disorder (PTSD) 01/30/2014  . Rheumatoid arthritis (HCC) 01/30/2014    Past Medical History:  Diagnosis Date  . Allergies   . Fibromyalgia   . Hypertension   . PTSD (post-traumatic stress disorder)   . Rheumatoid arthritis (HCC)   . Ulcerative colitis [facility])     Past Surgical History:  Procedure Laterality Date  . CESAREAN SECTION     2  . TONSILLECTOMY      Current Outpatient Medications  Medication Sig Dispense Refill  . albuterol (VENTOLIN HFA) 108 (90 Base) MCG/ACT inhaler Inhale 1 puff into the lungs every 4 (four) hours as needed.    . busPIRone (BUSPAR) 10 MG tablet Take 1 tablet by mouth as needed.    . DULoxetine  (CYMBALTA) 60 MG capsule Take 60 mg by mouth every morning.    . mesalamine (LIALDA) 1.2 g EC tablet Take 1 tablet by mouth daily. Take 4 tablets daily.    . montelukast (SINGULAIR) 10 MG tablet Take 10 mg by mouth daily.    . predniSONE (DELTASONE) 20 MG tablet Take 1 tablet by mouth daily. 12 day taper    . propranolol (INDERAL) 40 MG tablet Take 40 mg by mouth 2 (two) times daily.    . traZODone (DESYREL) 50 MG tablet Take 50 mg by mouth at bedtime.    . medroxyPROGESTERone (PROVERA) 5 MG tablet Take 1 tablet (5 mg total) by mouth daily. (Patient not taking: Reported on 12/23/2019) 5 tablet 0  . XELJANZ 5 MG TABS Take 1 tablet by mouth 2 (two) times daily.     No current facility-administered medications for this visit.     ALLERGIES: Codeine, Iodinated diagnostic agents, Norethindrone, and Emetrol  Family History  Problem Relation Age of Onset  . Polycystic kidney disease Mother   . Hypertension Mother   . Hypertension Maternal Grandmother   . Hypertension Maternal Grandfather     Social History   Socioeconomic History  . Marital status: Married    Spouse name: Not on  file  . Number of children: Not on file  . Years of education: Not on file  . Highest education level: Not on file  Occupational History  . Not on file  Tobacco Use  . Smoking status: Never Smoker  . Smokeless tobacco: Never Used  Substance and Sexual Activity  . Alcohol use: Yes    Comment: socially  . Drug use: Never  . Sexual activity: Yes    Birth control/protection: Surgical    Comment: vasectomy  Other Topics Concern  . Not on file  Social History Narrative  . Not on file   Social Determinants of Health   Financial Resource Strain:   . Difficulty of Paying Living Expenses: Not on file  Food Insecurity:   . Worried About Charity fundraiser in the Last Year: Not on file  . Ran Out of Food in the Last Year: Not on file  Transportation Needs:   . Lack of Transportation (Medical): Not on file   . Lack of Transportation (Non-Medical): Not on file  Physical Activity:   . Days of Exercise per Week: Not on file  . Minutes of Exercise per Session: Not on file  Stress:   . Feeling of Stress : Not on file  Social Connections:   . Frequency of Communication with Friends and Family: Not on file  . Frequency of Social Gatherings with Friends and Family: Not on file  . Attends Religious Services: Not on file  . Active Member of Clubs or Organizations: Not on file  . Attends Archivist Meetings: Not on file  . Marital Status: Not on file  Intimate Partner Violence:   . Fear of Current or Ex-Partner: Not on file  . Emotionally Abused: Not on file  . Physically Abused: Not on file  . Sexually Abused: Not on file    Review of Systems  Constitutional: Negative.   HENT: Negative.   Eyes: Negative.   Respiratory: Negative.   Cardiovascular: Negative.   Gastrointestinal: Negative.   Genitourinary: Negative.   Musculoskeletal: Negative.   Skin: Negative.   Neurological: Negative.   Endo/Heme/Allergies: Negative.   Psychiatric/Behavioral: Negative.     PHYSICAL EXAMINATION:    BP 124/82 (BP Location: Right Arm, Patient Position: Sitting, Cuff Size: Large)   Pulse 72   Temp 99 F (37.2 C) (Skin)   Wt 253 lb 6.4 oz (114.9 kg)   LMP 12/12/2018 (Exact Date)   BMI 46.35 kg/m     General appearance: alert, cooperative and appears stated age  Pelvic: External genitalia:  no lesions              Urethra:  normal appearing urethra with no masses, tenderness or lesions              Bartholins and Skenes: normal                 Vagina: normal appearing vagina with normal color and discharge, no lesions              Cervix:  no lesions  Sonohysterogram The procedure and risks of the procedure were reviewed with the patient, consent form was signed. A speculum was placed in the vagina and the cervix was cleansed with Hibiclens. The sonohysterogram catheter was inserted into  the uterine cavity without difficulty. Saline was infused under direct observation with the ultrasound. No intracavitary defects were noted.The catheter was removed. Ultrasound images reviewed with the patient.    Chaperone was present for  exam.  ASSESSMENT Menorrhagia with regular cycle, can't tolerate NSAID's, had an allergic reaction to micronor, 2 prior c/s with scaring noted on ultrasound so not a good candidate for endometrial ablation. She wasn't anemic on last check, but has been in the past Vasectomy for contraception.     PLAN Discussed potential treatment options, including: the mirena IUD, depo-provera, Lysteda, cyclic or daily provera and total laparoscopic hysterectomy, bilateral salpingectomies She will discuss the options with her allergist. She is also concerned about the possible effects of surgery on her Rheumatoid arthritis and Ulcerative Colitis   An After Visit Summary was printed and given to the patient.  In addition to the ultrasound and reviewing the ultrasound with the patient over 10 minutes was spent in counseling on possible treatment options of her menorrhagia (see the plan).

## 2019-12-25 ENCOUNTER — Encounter: Payer: Self-pay | Admitting: Obstetrics and Gynecology

## 2020-01-13 ENCOUNTER — Telehealth: Payer: Self-pay | Admitting: Obstetrics and Gynecology

## 2020-01-13 NOTE — Telephone Encounter (Signed)
I spoke with Dr Lacretia Nicks, Allergist. She thinks it's unlikely but not impossible that Tara Owen had a reaction to the minipill. She is looking into a possible blood test to check for progesterone sensitivity. We discussed other possible treatments and she thought the mirena would likely be worth trying. She will get back in touch with me after she finds out more information on the progesterone testing.

## 2020-03-05 ENCOUNTER — Telehealth: Payer: Self-pay

## 2020-03-05 NOTE — Telephone Encounter (Signed)
Patient canceled follow up appointment and does not wish to reschedule.

## 2020-03-10 ENCOUNTER — Ambulatory Visit: Payer: Managed Care, Other (non HMO) | Admitting: Obstetrics and Gynecology

## 2021-01-12 DIAGNOSIS — R600 Localized edema: Secondary | ICD-10-CM | POA: Insufficient documentation

## 2021-12-20 DIAGNOSIS — D509 Iron deficiency anemia, unspecified: Secondary | ICD-10-CM | POA: Insufficient documentation

## 2022-02-03 DIAGNOSIS — D84821 Immunodeficiency due to drugs: Secondary | ICD-10-CM | POA: Insufficient documentation

## 2022-03-01 ENCOUNTER — Telehealth: Payer: Self-pay

## 2022-03-01 NOTE — Telephone Encounter (Signed)
Patient has not had OV since 12/23/19. ? ?Patient called today because she is experiencing heavy bleeding/clots. ? ?LMP was in December 2022 until she started bleeding on 02/12/22 and has been bleeding daily since. She said at times it has been very extreme saturating a super pad and tampon in 30 minutes. She has bled through on her car seat and a chair. She said at times it slows down and is not that heavy. Today she is saturating super pad every 1 1/2 -2 hours. ? ?I did advise her Dr. Talbert Nan will want to have a visit and I scheduled her for tomorrow morning at 11:30am.   ? ?Of note patient said she has had numerous iron infusions in the last few months. Hgb was 11.0 on 02/03/2022. ?

## 2022-03-02 ENCOUNTER — Emergency Department (HOSPITAL_COMMUNITY): Payer: 59

## 2022-03-02 ENCOUNTER — Encounter (HOSPITAL_COMMUNITY): Payer: Self-pay

## 2022-03-02 ENCOUNTER — Ambulatory Visit (INDEPENDENT_AMBULATORY_CARE_PROVIDER_SITE_OTHER): Payer: 59 | Admitting: Obstetrics and Gynecology

## 2022-03-02 ENCOUNTER — Encounter: Payer: Self-pay | Admitting: Obstetrics and Gynecology

## 2022-03-02 ENCOUNTER — Emergency Department (HOSPITAL_COMMUNITY)
Admission: EM | Admit: 2022-03-02 | Discharge: 2022-03-02 | Disposition: A | Discharge status: Home / Self Care | Payer: 59 | Attending: Emergency Medicine | Admitting: Emergency Medicine

## 2022-03-02 ENCOUNTER — Other Ambulatory Visit (HOSPITAL_COMMUNITY)
Admission: RE | Admit: 2022-03-02 | Discharge: 2022-03-02 | Disposition: A | Discharge status: Home / Self Care | Payer: 59 | Source: Ambulatory Visit | Attending: Obstetrics and Gynecology | Admitting: Obstetrics and Gynecology

## 2022-03-02 VITALS — BP 132/80 | HR 77 | Ht 63.0 in | Wt 240.0 lb

## 2022-03-02 DIAGNOSIS — Z862 Personal history of diseases of the blood and blood-forming organs and certain disorders involving the immune mechanism: Secondary | ICD-10-CM

## 2022-03-02 DIAGNOSIS — N939 Abnormal uterine and vaginal bleeding, unspecified: Secondary | ICD-10-CM | POA: Insufficient documentation

## 2022-03-02 DIAGNOSIS — D649 Anemia, unspecified: Secondary | ICD-10-CM | POA: Insufficient documentation

## 2022-03-02 DIAGNOSIS — R55 Syncope and collapse: Secondary | ICD-10-CM | POA: Insufficient documentation

## 2022-03-02 DIAGNOSIS — N926 Irregular menstruation, unspecified: Secondary | ICD-10-CM

## 2022-03-02 DIAGNOSIS — E860 Dehydration: Secondary | ICD-10-CM | POA: Diagnosis not present

## 2022-03-02 DIAGNOSIS — R42 Dizziness and giddiness: Secondary | ICD-10-CM | POA: Diagnosis present

## 2022-03-02 LAB — D-DIMER, QUANTITATIVE: D-Dimer, Quant: 0.32 ug/mL-FEU (ref 0.00–0.50)

## 2022-03-02 LAB — CBC WITH DIFFERENTIAL/PLATELET
Abs Immature Granulocytes: 0.03 10*3/uL (ref 0.00–0.07)
Basophils Absolute: 0.1 10*3/uL (ref 0.0–0.1)
Basophils Relative: 1 %
Eosinophils Absolute: 0.3 10*3/uL (ref 0.0–0.5)
Eosinophils Relative: 4 %
HCT: 32.9 % — ABNORMAL LOW (ref 36.0–46.0)
Hemoglobin: 10.5 g/dL — ABNORMAL LOW (ref 12.0–15.0)
Immature Granulocytes: 1 %
Lymphocytes Relative: 33 %
Lymphs Abs: 2 10*3/uL (ref 0.7–4.0)
MCH: 27 pg (ref 26.0–34.0)
MCHC: 31.9 g/dL (ref 30.0–36.0)
MCV: 84.6 fL (ref 80.0–100.0)
Monocytes Absolute: 1 10*3/uL (ref 0.1–1.0)
Monocytes Relative: 16 %
Neutro Abs: 2.9 10*3/uL (ref 1.7–7.7)
Neutrophils Relative %: 45 %
Platelets: 333 10*3/uL (ref 150–400)
RBC: 3.89 MIL/uL (ref 3.87–5.11)
RDW: 22.2 % — ABNORMAL HIGH (ref 11.5–15.5)
WBC: 6.3 10*3/uL (ref 4.0–10.5)
nRBC: 0 % (ref 0.0–0.2)

## 2022-03-02 LAB — BASIC METABOLIC PANEL
Anion gap: 8 (ref 5–15)
BUN: 14 mg/dL (ref 6–20)
CO2: 23 mmol/L (ref 22–32)
Calcium: 9.3 mg/dL (ref 8.9–10.3)
Chloride: 106 mmol/L (ref 98–111)
Creatinine, Ser: 0.77 mg/dL (ref 0.44–1.00)
GFR, Estimated: >60 mL/min (ref >60)
Glucose, Bld: 90 mg/dL (ref 70–99)
Potassium: 3.5 mmol/L (ref 3.5–5.1)
Sodium: 137 mmol/L (ref 135–145)

## 2022-03-02 LAB — I-STAT CHEM 8, ED
BUN: 11 mg/dL (ref 6–20)
Calcium, Ion: 1.16 mmol/L (ref 1.15–1.40)
Chloride: 104 mmol/L (ref 98–111)
Creatinine, Ser: 0.7 mg/dL (ref 0.44–1.00)
Glucose, Bld: 93 mg/dL (ref 70–99)
HCT: 32 % — ABNORMAL LOW (ref 36.0–46.0)
Hemoglobin: 10.9 g/dL — ABNORMAL LOW (ref 12.0–15.0)
Potassium: 3.6 mmol/L (ref 3.5–5.1)
Sodium: 139 mmol/L (ref 135–145)
TCO2: 23 mmol/L (ref 22–32)

## 2022-03-02 LAB — TROPONIN I (HIGH SENSITIVITY)
Troponin I (High Sensitivity): 4 ng/L (ref <18)
Troponin I (High Sensitivity): 4 ng/L (ref <18)

## 2022-03-02 LAB — CBG MONITORING, ED
Glucose-Capillary: 83 mg/dL (ref 70–99)
Glucose-Capillary: 96 mg/dL (ref 70–99)

## 2022-03-02 LAB — TYPE AND SCREEN
ABO/RH(D): O POS
Antibody Screen: NEGATIVE

## 2022-03-02 MED ORDER — SODIUM CHLORIDE 0.9 % IV BOLUS
1000.0000 mL | Freq: Once | INTRAVENOUS | Status: AC
  Administered 2022-03-02: 1000 mL via INTRAVENOUS

## 2022-03-02 NOTE — ED Notes (Signed)
Patient made aware she could have something to eat.  ?

## 2022-03-02 NOTE — ED Provider Triage Note (Signed)
Emergency Medicine Provider Triage Evaluation Note ? ?Tara Owen , a 51 y.o. female  was evaluated in triage sent by med office.  Pt complains of continued vaginal bleeding over the last month.  Had a D&C this morning, still has some bleeding.  Was noted to be orthostatic, lightheaded, dizzy.  Has received 3-4 iron transfusions in the last few weeks.  Strong history of panic disorder.  Currently followed by GI for ulcerative colitis and by rheumatology for rheumatoid arthritis.  Denies chest pain, shortness of breath, abdominal pain.  Denies recent fever or infection. ? ?Review of Systems  ?Positive: As above ?Negative: As above ? ?Physical Exam  ?BP (!) 171/73 (BP Location: Left Arm)   Pulse 92   Temp 99 ?F (37.2 ?C) (Oral)   Resp 18   LMP 02/12/2022   SpO2 100%  ?Gen:   Awake, no distress   ?Resp:  Normal effort, CTAB ?MSK:   Moves extremities without difficulty  ?Other:  Appears clinically fatigued; pale; RRR w/o M/R/G; 2+ radial and DP pulses ? ?Medical Decision Making  ?Medically screening exam initiated at 1:50 PM.  Appropriate orders placed.  Tara Owen was informed that the remainder of the evaluation will be completed by another provider, this initial triage assessment does not replace that evaluation, and the importance of remaining in the ED until their evaluation is complete. ? ?Labs and EKG ordered ?  ?Cecil Cobbs, PA-C ?03/02/22 1403 ? ?

## 2022-03-02 NOTE — Patient Instructions (Signed)

## 2022-03-02 NOTE — Discharge Instructions (Signed)
Drink plenty of fluids.  Return if any problems.  Follow-up with your doctor next week for recheck ?

## 2022-03-02 NOTE — Progress Notes (Signed)
GYNECOLOGY  VISIT ?  ?HPI: ?51 y.o.   Married White or Caucasian Not Hispanic or Latino  female   ?[id number] with Patient's last menstrual period was 02/12/2022.   ?here for Menorrhagia. Her most recent period started 02/12/22 She is soaking a pad every where from 15 min to 1.5 hours.  ?Cycles were mostly every month up until this year. She had a normal cycle in 12/22 and then no cycle until ~02/12/22. She had couple of episodes of spotting between 12/22 and 3/23.  ?She has been bleeding heavily since 02/12/22. Has gushes of bleeding, saturating through clothes. Today her bleeding is minimal.  ?She does feel very fatigued, light headed, foggy.  ? ?H/O anemia, last hgb was 11 on 02/03/22. S/P iron transfusions x 3 in the last month.  ? ?02/03/22 labs ?TSH 1.81 ?Glucose 106, otherwise normal CMP ? ?She had a negative sonohysterogram 2 years ago.  ? ?GYNECOLOGIC HISTORY: ?Patient's last menstrual period was 02/12/2022. ?Contraception:none not sexually active  ?Menopausal hormone therapy: none  ?       ?OB History   ? ? Gravida  ?3  ? Para  ?3  ? Term  ?3  ? Preterm  ?   ? AB  ?   ? Living  ?3  ?  ? ? SAB  ?   ? IAB  ?   ? Ectopic  ?   ? Multiple  ?   ? Live Births  ?2  ?   ?  ?  ?    ? ?Patient Active Problem List  ? Diagnosis Date Noted  ? Ulcerative colitis without complications ([place]) [id number]  ? Glenohumeral arthritis, left 12/19/2017  ? Primary osteoarthritis of both knees 05/24/2017  ? Panic disorder 04/30/2017  ? Morbid obesity ([place]) 04/30/2017  ? Major depression, recurrent ([place]) 05/11/2016  ? Migraine without aura and without status migrainosus, not intractable 04/13/2016  ? Essential hypertension with goal blood pressure less than 140/90 04/29/2015  ? Radiculopathy, lumbosacral region 04/27/2014  ? Myofascial pain 04/27/2014  ? Vitamin D deficiency 04/23/2014  ? Fibromyalgia 01/30/2014  ? Environmental allergies 01/30/2014  ? Heart murmur 01/30/2014  ? History of posttraumatic stress disorder (PTSD) 01/30/2014  ?  Rheumatoid arthritis ([place]) 01/30/2014  ? ? ?Past Medical History:  ?Diagnosis Date  ? Allergies   ? Fibromyalgia   ? Hypertension   ? PTSD (post-traumatic stress disorder)   ? Rheumatoid arthritis ([place])   ? Ulcerative colitis ([place])   ? ? ?Past Surgical History:  ?Procedure Laterality Date  ? CESAREAN SECTION    ? 2  ? TONSILLECTOMY    ? ? ?Current Outpatient Medications  ?Medication Sig Dispense Refill  ? busPIRone (BUSPAR) 10 MG tablet Take 1 tablet by mouth as needed.    ? Cholecalciferol (VITAMIN D3) 250 MCG (10000 UT) capsule     ? Cyanocobalamin (VITAMIN B 12) 500 MCG TABS     ? diclofenac Sodium (VOLTAREN) 1 % GEL     ? Diindolylmethane POWD     ? DULoxetine (CYMBALTA) 60 MG capsule Take 60 mg by mouth every morning.    ? famotidine (PEPCID) 40 MG tablet     ? fluticasone (FLONASE) 50 MCG/ACT nasal spray     ? lisinopril (ZESTRIL) 10 MG tablet     ? Magnesium 200 MG TABS     ? mesalamine (LIALDA) 1.2 g EC tablet Take 1 tablet by mouth daily. Take 4 tablets daily.    ? mesalamine (LIALDA) 1.2  g EC tablet Take by mouth.    ? metoprolol succinate (TOPROL-XL) 25 MG 24 hr tablet     ? montelukast (SINGULAIR) 10 MG tablet Take 10 mg by mouth daily.    ? montelukast (SINGULAIR) 10 MG tablet     ? Multiple Vitamins-Minerals (ONE-A-DAY 50 PLUS PO)     ? Omega-3 1000 MG CAPS     ? omeprazole (PRILOSEC) 40 MG capsule TAKE 1 CAPSULE(40 MG) BY MOUTH DAILY    ? Sarilumab (KEVZARA) 200 MG/1.14ML SOAJ     ? traZODone (DESYREL) 50 MG tablet Take 50 mg by mouth at bedtime.    ? Turmeric 500 MG CAPS     ? valACYclovir (VALTREX) 500 MG tablet TAKE 1 TABLET(500 MG) BY MOUTH DAILY    ? albuterol (VENTOLIN HFA) 108 (90 Base) MCG/ACT inhaler Inhale 1 puff into the lungs every 4 (four) hours as needed.    ? ?No current facility-administered medications for this visit.  ?  ? ?ALLERGIES: Codeine, Iodinated contrast media, Norethindrone, Emetrol, and Tofacitinib ? ?Family History  ?Problem Relation Age of Onset  ? Polycystic kidney  disease Mother   ? Hypertension Mother   ? Hypertension Maternal Grandmother   ? Hypertension Maternal Grandfather   ? ? ?Social History  ? ?Socioeconomic History  ? Marital status: Married  ?  Spouse name: Not on file  ? Number of children: Not on file  ? Years of education: Not on file  ? Highest education level: Not on file  ?Occupational History  ? Not on file  ?Tobacco Use  ? Smoking status: Never  ? Smokeless tobacco: Never  ?Vaping Use  ? Vaping Use: Never used  ?Substance and Sexual Activity  ? Alcohol use: Yes  ?  Comment: socially  ? Drug use: Never  ? Sexual activity: Yes  ?  Birth control/protection: Surgical  ?  Comment: vasectomy  ?Other Topics Concern  ? Not on file  ?Social History Narrative  ? Not on file  ? ?Social Determinants of Health  ? ?Financial Resource Strain: Not on file  ?Food Insecurity: Not on file  ?Transportation Needs: Not on file  ?Physical Activity: Not on file  ?Stress: Not on file  ?Social Connections: Not on file  ?Intimate Partner Violence: Not on file  ? ? ?Review of Systems  ?Genitourinary:   ?     Vaginal bleeding   ?All other systems reviewed and are negative. ? ?PHYSICAL EXAMINATION:   ? ?BP 132/80   Pulse 77   Ht 5\' 3"  (1.6 m)   Wt 240 lb (108.9 kg)   LMP 02/12/2022   SpO2 99%   BMI 42.51 kg/m?     ?General appearance: alert, cooperative and appears stated age ? ?Pelvic: External genitalia:  no lesions ?             Urethra:  normal appearing urethra with no masses, tenderness or lesions ?             Bartholins and Skenes: normal    ?             Vagina: normal appearing vagina with normal color and discharge, no lesions ?             Cervix: no lesions. Fixed, doesn't move with traction from the tenaculum ?             Bimanual Exam:  Uterus:   no masses or tenderness, exam limited by BMI ?  Adnexa: no mass, fullness, tenderness ?  ?The risks of endometrial biopsy were reviewed and a consent was obtained.  ?A speculum was placed in the vagina and the  cervix was cleansed with betadine. A tenaculum was placed on the cervix and the pipelle was placed into the endometrial cavity. The uterus sounded to 7 cm. The endometrial biopsy was performed, taking care to get a representative sample, sampling 360 degrees of the uterine cavity. Minimal tissue was obtained, the cavity felt gritty throughout. The tenaculum and speculum were removed. There were no complications.  ?             ? ?Chaperone was present for exam. ? ?1. Abnormal uterine bleeding ?Office curettage done, cavity feels empty. Not actively bleeding ?Previously had an allergic reaction to oral progesterone ?- Ferritin: not done ?- CBC, stat: not done ?On attempt to get orthostatic vitals the patient felt presyncopal.  ?BP laying down was 140/84 and her pulse was 82, sitting her BP was 112/62 and her pulse was 90. Upon standing she got presyndopal and started to fall. The CMA was able to catch her. ?Will send to the ER ?- IUD Insertion; Future ?- US PELVIS TRANSVAGINAL NON-OB (TV ONLY); Future ?- Surgical pathology( [place]/ [facility]) ? ?2. History of anemia ?Currently light headed, presyncopal ?-Will send to the ER ? ?3. Irregular periods/menstrual cycles ?Can't take oral hormones ?We discussed the option of the mirena IUD or hysterectomy if she can't tolerate the IUD ? ?

## 2022-03-02 NOTE — ED Notes (Signed)
This RN called to lobby as pt had a syncopal episode. Upon arrival to lobby pt was pale and slumped over in the lobby chair. Pt able to be assisted to wheelchair and rolled into the lobby area. Agricultural consultant and PA made aware, pt will be taken back to a regular room in the back.  ?

## 2022-03-02 NOTE — ED Triage Notes (Addendum)
Pt presents with c/o vaginal bleeding for approx one month. Pt has had 4 iron transfusions this month. Pt is being sent from her MD office. Pt was orthostatic per EMS. Pt did have a D&C this morning and is still having some bleeding. ?

## 2022-03-02 NOTE — ED Provider Notes (Signed)
?[facility] DEPT ?Provider Note ? ? ?CSN: [id number] ?Arrival date & time: 03/02/22  1321 ? ?  ? ?History ? ?Chief Complaint  ?Patient presents with  ? Vaginal Bleeding  ? ? ?Tara Owen is a 51 y.o. female. ? ?Patient was at the OB/GYN's office today and had a curette done to  her uterus because of heavy menses.  She became dizzy and almost passed out..  Patient has a past medical history of heavy menses and anemia.  Patient has a history of ulcerative colitis.  Patient had a syncopal episode in the lobby ? ?The history is provided by the patient and medical records. No language interpreter was used.  ?Near Syncope ?This is a new problem. The current episode started 1 to 2 hours ago. The problem occurs rarely. The problem has been gradually improving. Pertinent negatives include no chest pain, no abdominal pain and no headaches. Nothing aggravates the symptoms. Nothing relieves the symptoms. She has tried nothing for the symptoms. The treatment provided no relief.  ? ?  ? ?Home Medications ?Prior to Admission medications   ?Medication Sig Start Date End Date Taking? Authorizing Provider  ?albuterol (VENTOLIN HFA) 108 (90 Base) MCG/ACT inhaler Inhale 1 puff into the lungs every 4 (four) hours as needed. 12/17/19 12/16/20  [provider]  ?busPIRone (BUSPAR) 10 MG tablet Take 1 tablet by mouth as needed.    [provider]  ?Cholecalciferol (VITAMIN D3) 250 MCG (10000 UT) capsule     [provider]  ?Cyanocobalamin (VITAMIN B 12) 500 MCG TABS     [provider]  ?diclofenac Sodium (VOLTAREN) 1 % GEL     [provider]  ?Diindolylmethane POWD     [provider]  ?DULoxetine (CYMBALTA) 60 MG capsule Take 60 mg by mouth every morning. 09/14/19   [provider]  ?famotidine (PEPCID) 40 MG tablet  08/29/21   [provider]  ?fluticasone (FLONASE) 50 MCG/ACT nasal spray     [provider]  ?lisinopril  (ZESTRIL) 10 MG tablet     [provider]  ?Magnesium 200 MG TABS     [provider]  ?mesalamine (LIALDA) 1.2 g EC tablet Take 1 tablet by mouth daily. Take 4 tablets daily. 08/16/19   [provider]  ?mesalamine (LIALDA) 1.2 g EC tablet Take by mouth. 01/02/22 07/01/22  [provider]  ?metoprolol succinate (TOPROL-XL) 25 MG 24 hr tablet     [provider]  ?montelukast (SINGULAIR) 10 MG tablet Take 10 mg by mouth daily. 08/31/19   [provider]  ?montelukast (SINGULAIR) 10 MG tablet     [provider]  ?Multiple Vitamins-Minerals (ONE-A-DAY 50 PLUS PO)     [provider]  ?Omega-3 1000 MG CAPS     [provider]  ?omeprazole (PRILOSEC) 40 MG capsule TAKE 1 CAPSULE(40 MG) BY MOUTH DAILY 01/18/22   [provider]  ?Sarilumab (KEVZARA) 200 MG/1.14ML SOAJ     [provider]  ?traZODone (DESYREL) 50 MG tablet Take 50 mg by mouth at bedtime. 09/14/19   [provider]  ?Turmeric 500 MG CAPS     [provider]  ?valACYclovir (VALTREX) 500 MG tablet TAKE 1 TABLET(500 MG) BY MOUTH DAILY 01/11/22   [provider]  ?   ? ?Allergies    ?Codeine, Iodinated contrast media, Norethindrone, Emetrol, Tofacitinib, Abatacept, Gabapentin, Infliximab, Iron, Metoclopramide, Pregabalin, Promethazine, and Zonisamide   ? ?Review of Systems   ?Review  of Systems  ?Constitutional:  Negative for appetite change and fatigue.  ?HENT:  Negative for congestion, ear discharge and sinus pressure.   ?Eyes:  Negative for discharge.  ?Respiratory:  Negative for cough.   ?Cardiovascular:  Positive for near-syncope. Negative for chest pain.  ?Gastrointestinal:  Negative for abdominal pain and diarrhea.  ?Genitourinary:  Negative for frequency and hematuria.  ?Musculoskeletal:  Negative for back pain.  ?Skin:  Negative for rash.  ?Neurological:  Positive for dizziness. Negative for seizures and headaches.   ?Psychiatric/Behavioral:  Negative for hallucinations.   ? ?Physical Exam ?Updated Vital Signs ?BP (!) 148/67   Pulse 96   Temp 98.7 ?F (37.1 ?C) (Oral)   Resp 14   LMP 02/12/2022   SpO2 100%  ?Physical Exam ?Vitals and nursing note reviewed.  ?Constitutional:   ?   Appearance: She is well-developed.  ?HENT:  ?   Head: Normocephalic.  ?Eyes:  ?   General: No scleral icterus. ?   Conjunctiva/sclera: Conjunctivae normal.  ?Neck:  ?   Thyroid: No thyromegaly.  ?Cardiovascular:  ?   Rate and Rhythm: Normal rate and regular rhythm.  ?   Heart sounds: No murmur heard. ?  No friction rub. No gallop.  ?Pulmonary:  ?   Breath sounds: No stridor. No wheezing or rales.  ?Chest:  ?   Chest wall: No tenderness.  ?Abdominal:  ?   General: There is no distension.  ?   Tenderness: There is no abdominal tenderness. There is no rebound.  ?Musculoskeletal:     ?   General: Normal range of motion.  ?   Cervical back: Neck supple.  ?Lymphadenopathy:  ?   Cervical: No cervical adenopathy.  ?Skin: ?   Findings: No erythema or rash.  ?Neurological:  ?   Mental Status: She is alert and oriented to person, place, and time.  ?   Motor: No abnormal muscle tone.  ?   Coordination: Coordination normal.  ?Psychiatric:     ?   Behavior: Behavior normal.  ? ? ?ED Results / Procedures / Treatments   ?Labs ?(all labs ordered are listed, but only abnormal results are displayed) ?Labs Reviewed  ?CBC WITH DIFFERENTIAL/PLATELET - Abnormal; Notable for the following components:  ?    Result Value  ? Hemoglobin 10.5 (*)   ? HCT 32.9 (*)   ? RDW 22.2 (*)   ? All other components within normal limits  ?I-STAT CHEM 8, ED - Abnormal; Notable for the following components:  ? Hemoglobin 10.9 (*)   ? HCT 32.0 (*)   ? All other components within normal limits  ?BASIC METABOLIC PANEL  ?D-DIMER, QUANTITATIVE  ?CBG MONITORING, ED  ?CBG MONITORING, ED  ?TYPE AND SCREEN  ?ABO/RH  ?TROPONIN I (HIGH SENSITIVITY)  ?TROPONIN I (HIGH SENSITIVITY)   ? ? ?EKG ?None ? ?Radiology ?CT Head Wo Contrast ? ?Result Date: 03/02/2022 ?CLINICAL DATA:  Head trauma, abnormal mental status (Age 5-64y) EXAM: CT HEAD WITHOUT CONTRAST TECHNIQUE: Contiguous axial images were obtained from the base of the skull through the vertex without intravenous contrast. RADIATION DOSE REDUCTION: This exam was performed according to the departmental dose-optimization program which includes automated exposure control, adjustment of the mA and/or kV according to patient size and/or use of iterative reconstruction technique. COMPARISON:  None. FINDINGS: Brain: No evidence of acute infarction, hemorrhage, hydrocephalus, extra-axial collection or mass lesion/mass effect. Vascular: No hyperdense vessel identified. Skull: No acute fracture. Sinuses/Orbits: Clear sinuses.  No acute orbital findings. Other: No  mastoid effusions. IMPRESSION: No evidence of acute intracranial abnormality. Electronically Signed   By: Margaretha Sheffield M.D.   On: 03/02/2022 18:52  ? ?DG Chest Port 1 View ? ?Result Date: 03/02/2022 ?CLINICAL DATA:  Shortness of breath EXAM: PORTABLE CHEST 1 VIEW COMPARISON:  None. FINDINGS: Normal heart size for portable AP technique.The cardiomediastinal contours are normal. The lungs are clear. Pulmonary vasculature is normal. No consolidation, pleural effusion, or pneumothorax. No acute osseous abnormalities are seen. IMPRESSION: No acute chest findings. Electronically Signed   By: Keith Rake M.D.   On: 03/02/2022 18:50   ? ?Procedures ?Procedures  ? ? ?Medications Ordered in ED ?Medications  ?sodium chloride 0.9 % bolus 1,000 mL (1,000 mLs Intravenous New Bag/Given 03/02/22 1809)  ?sodium chloride 0.9 % bolus 1,000 mL (1,000 mLs Intravenous New Bag/Given 03/02/22 2112)  ? ? ?ED Course/ Medical Decision Making/ A&P ?Clinical Course as of 03/02/22 2235  ?Thu Mar 02, 2022  ?1757 Patient had syncopal event in the waiting room.  I evaluated her, she is pale but not diaphoretic.  CBG  obtained, not hypoglycemic.  Patient will be going back to her room shortly, she told me she was having chest pains prior to the syncopal episode. Patient was seen at gynecologist office earlier today and had an endometrial biopsy, she became orthos

## 2022-03-02 NOTE — ED Notes (Signed)
Unable to find a good vein for blood work. ?

## 2022-03-03 ENCOUNTER — Telehealth: Payer: Self-pay | Admitting: Obstetrics and Gynecology

## 2022-03-03 ENCOUNTER — Ambulatory Visit: Payer: 59 | Admitting: Obstetrics and Gynecology

## 2022-03-03 NOTE — Telephone Encounter (Signed)
Spoke with patient.  ?Patient reports lightheadedness since she returned home from the ER. States she has been monitoring her B/p since she has been home, is noticing changes in B/P with position changes. Patient reports vaginal bleeding as spotting, no heavy bleeding. Patient is unclear of next steps in care, she is unsure if she desires to proceed with IUD, did not get to complete that discussion at [facility] on 4/13. Patient is scheduled to see GI today at 2:30pm and is scheduled to see Dr. Talbert Nan at 2:30pm as well, would like to cancel OV today if not necessary. OV cancelled.  I asked patient if she has contacted her PCP to provide update, she has not. Advised I will review plan of care with Dr. Talbert Nan and return call. ? ? ?Dr. Talbert Nan -please review and advise on plan of care.  ?

## 2022-03-03 NOTE — Telephone Encounter (Signed)
Left message to call Sharee Pimple, RN at [facility], [phone], OPT 5.  ? ?Reviewed with Dr. Talbert Nan.  ?Patient should f/u with PCP for further evaluation of symptoms.  ?Schedule OV/MyChart visit to further discuss tx options for bleeding.  ?

## 2022-03-03 NOTE — Telephone Encounter (Signed)
Spoke with patient, advised per Dr. Talbert Nan.  ?MyChart visit scheduled for 4/18 at 12pm.  ?Patient verbalizes understanding and is agreeable.  ? ?Routing to provider for final review. Patient is agreeable to disposition. Will close encounter. ? ?

## 2022-03-03 NOTE — Telephone Encounter (Signed)
Please call and check on the patient, she was sent to the ER yesterday for presyncopal event. Was evaluated, hydrated and sent home yesterday.  ?

## 2022-03-06 ENCOUNTER — Other Ambulatory Visit: Payer: Self-pay

## 2022-03-06 LAB — SURGICAL PATHOLOGY

## 2022-03-07 ENCOUNTER — Ambulatory Visit: Payer: 59 | Admitting: Obstetrics and Gynecology

## 2022-03-07 ENCOUNTER — Other Ambulatory Visit: Payer: Self-pay

## 2022-03-07 ENCOUNTER — Telehealth (INDEPENDENT_AMBULATORY_CARE_PROVIDER_SITE_OTHER): Payer: 59 | Admitting: Obstetrics and Gynecology

## 2022-03-07 ENCOUNTER — Encounter: Payer: Self-pay | Admitting: Obstetrics and Gynecology

## 2022-03-07 DIAGNOSIS — N939 Abnormal uterine and vaginal bleeding, unspecified: Secondary | ICD-10-CM

## 2022-03-07 DIAGNOSIS — Z862 Personal history of diseases of the blood and blood-forming organs and certain disorders involving the immune mechanism: Secondary | ICD-10-CM

## 2022-03-07 DIAGNOSIS — N951 Menopausal and female climacteric states: Secondary | ICD-10-CM | POA: Diagnosis not present

## 2022-03-07 DIAGNOSIS — Z30430 Encounter for insertion of intrauterine contraceptive device: Secondary | ICD-10-CM

## 2022-03-07 NOTE — Progress Notes (Signed)
Virtual Visit via Video Note ? ?I connected with Tara Owen on 03/07/22 at 12:00 PM EDT by a video enabled telemedicine application and verified that I am speaking with the correct person using two identifiers. ? ?Location: ?Patient: Home ?Provider: Office of [facility] ?  ?I discussed the limitations of evaluation and management by telemedicine and the availability of in person appointments. The patient expressed understanding and agreed to proceed. ? ?GYNECOLOGY  VISIT ?  ?HPI: ?51 y.o.   Married White or Caucasian Not Hispanic or Latino  female   ?[id number] with Patient's last menstrual period was 02/12/2022.   ?here for a virtual visit for f/u of AUB. She was seen in the office last week after prolonged, heavy bleeding. At the time of her visit her bleeding had stopped. ?She underwent an office D&C with minimal tissue obtained. The biopsy was benign but scant endometrial cells.  ?She was sent to the ER secondary to a presyncopal event while trying to get Orthostatics on her at that visit. Her Hgb in the ER was 10.5.  ?Currently bleeding lightly. Still having some cramping.  ? ?Over the last year her cycles were 25-33 days except for the last one that was 97 days apart.  ?Typically bleeds for 7-8 days. She saturates an ultra tampon in 2-3 hours.  ? ?She has struggled with anemia even prior to the last cycle. She has been getting iron transfusions since 1/23.  ? ?She has an allergy to oral progesterone (norethindrone).  ? ?GYNECOLOGIC HISTORY: ?Patient's last menstrual period was 02/12/2022. ?Contraception:not sexually active  ?Menopausal hormone therapy: none ?       ?OB History   ? ? Gravida  ?3  ? Para  ?3  ? Term  ?3  ? Preterm  ?   ? AB  ?   ? Living  ?3  ?  ? ? SAB  ?   ? IAB  ?   ? Ectopic  ?   ? Multiple  ?   ? Live Births  ?2  ?   ?  ?  ?    ? ?Patient Active Problem List  ? Diagnosis Date Noted  ? Ulcerative colitis without complications ([place]) [id number]  ? Glenohumeral arthritis, left 12/19/2017  ? Primary  osteoarthritis of both knees 05/24/2017  ? Panic disorder 04/30/2017  ? Morbid obesity ([place]) 04/30/2017  ? Major depression, recurrent ([place]) 05/11/2016  ? Migraine without aura and without status migrainosus, not intractable 04/13/2016  ? Essential hypertension with goal blood pressure less than 140/90 04/29/2015  ? Radiculopathy, lumbosacral region 04/27/2014  ? Myofascial pain 04/27/2014  ? Vitamin D deficiency 04/23/2014  ? Fibromyalgia 01/30/2014  ? Environmental allergies 01/30/2014  ? Heart murmur 01/30/2014  ? History of posttraumatic stress disorder (PTSD) 01/30/2014  ? Rheumatoid arthritis ([place]) 01/30/2014  ? ? ?Past Medical History:  ?Diagnosis Date  ? Allergies   ? Fibromyalgia   ? Hypertension   ? PTSD (post-traumatic stress disorder)   ? Rheumatoid arthritis ([place])   ? Ulcerative colitis ([place])   ? ? ?Past Surgical History:  ?Procedure Laterality Date  ? CESAREAN SECTION    ? 2  ? TONSILLECTOMY    ? ? ?Current Outpatient Medications  ?Medication Sig Dispense Refill  ? albuterol (VENTOLIN HFA) 108 (90 Base) MCG/ACT inhaler Inhale 1 puff into the lungs every 4 (four) hours as needed.    ? busPIRone (BUSPAR) 10 MG tablet Take 1 tablet by mouth as needed.    ? Cholecalciferol (  VITAMIN D3) 250 MCG (10000 UT) capsule     ? Cyanocobalamin (VITAMIN B 12) 500 MCG TABS     ? diclofenac Sodium (VOLTAREN) 1 % GEL     ? Diindolylmethane POWD     ? DULoxetine (CYMBALTA) 60 MG capsule Take 60 mg by mouth every morning.    ? famotidine (PEPCID) 40 MG tablet     ? fluticasone (FLONASE) 50 MCG/ACT nasal spray     ? lisinopril (ZESTRIL) 10 MG tablet     ? Magnesium 200 MG TABS     ? mesalamine (LIALDA) 1.2 g EC tablet Take 1 tablet by mouth daily. Take 4 tablets daily.    ? mesalamine (LIALDA) 1.2 g EC tablet Take by mouth.    ? metoprolol succinate (TOPROL-XL) 25 MG 24 hr tablet     ? montelukast (SINGULAIR) 10 MG tablet Take 10 mg by mouth daily.    ? montelukast (SINGULAIR) 10 MG tablet     ? Multiple Vitamins-Minerals  (ONE-A-DAY 50 PLUS PO)     ? Omega-3 1000 MG CAPS     ? omeprazole (PRILOSEC) 40 MG capsule TAKE 1 CAPSULE(40 MG) BY MOUTH DAILY    ? Sarilumab (KEVZARA) 200 MG/1.14ML SOAJ     ? traZODone (DESYREL) 50 MG tablet Take 50 mg by mouth at bedtime.    ? Turmeric 500 MG CAPS     ? valACYclovir (VALTREX) 500 MG tablet TAKE 1 TABLET(500 MG) BY MOUTH DAILY    ? ?No current facility-administered medications for this visit.  ?  ? ?ALLERGIES: Codeine, Iodinated contrast media, Norethindrone, Emetrol, Tofacitinib, Abatacept, Gabapentin, Infliximab, Iron, Metoclopramide, Pregabalin, Promethazine, and Zonisamide ? ?Family History  ?Problem Relation Age of Onset  ? Polycystic kidney disease Mother   ? Hypertension Mother   ? Hypertension Maternal Grandmother   ? Hypertension Maternal Grandfather   ? ? ?Social History  ? ?Socioeconomic History  ? Marital status: Married  ?  Spouse name: Not on file  ? Number of children: Not on file  ? Years of education: Not on file  ? Highest education level: Not on file  ?Occupational History  ? Not on file  ?Tobacco Use  ? Smoking status: Never  ? Smokeless tobacco: Never  ?Vaping Use  ? Vaping Use: Never used  ?Substance and Sexual Activity  ? Alcohol use: Yes  ?  Comment: socially  ? Drug use: Never  ? Sexual activity: Yes  ?  Birth control/protection: Surgical  ?  Comment: vasectomy  ?Other Topics Concern  ? Not on file  ?Social History Narrative  ? Not on file  ? ?Social Determinants of Health  ? ?Financial Resource Strain: Not on file  ?Food Insecurity: Not on file  ?Transportation Needs: Not on file  ?Physical Activity: Not on file  ?Stress: Not on file  ?Social Connections: Not on file  ?Intimate Partner Violence: Not on file  ? ? ?ROS ? ?PHYSICAL EXAMINATION:   ? ?LMP 02/12/2022     ?General appearance: alert, cooperative and appears stated age ? ?1. Abnormal uterine bleeding ?She had an anovulatory bleed recently. Biopsy after the bleed with scant benign tissue ?-She is returning for an  ultrasound.  ?-We discussed the use of progesterone to prevent future anovulatory bleeds. Given her h/o an allergic reaction to norethindrone, her options are compounding a progesterone or trying a mirena IUD. ?-Discussed the [place] IUD, including risks with insertion ?-She has a fixed uterus, will insert the IUD under ultrasound guidance.  ? ?  2. History of anemia ?S/P iron transfusions ?-return for u/s and mirena IUD insertion  ? ?3. Perimenopausal ?As above.  ? ?22 minutes in total patient care.  ? ?

## 2022-04-05 ENCOUNTER — Telehealth: Payer: Self-pay

## 2022-04-05 NOTE — Telephone Encounter (Signed)
Patient called stating she has decided she would like to try the compounded Progesterone that she discussed with you.   ? ?03/07/22 visit note "-We discussed the use of progesterone to prevent future anovulatory bleeds. Given her h/o an allergic reaction to norethindrone, her options are compounding a progesterone or trying a mirena IUD." ?

## 2022-04-06 ENCOUNTER — Other Ambulatory Visit: Payer: 59 | Admitting: Obstetrics and Gynecology

## 2022-04-06 ENCOUNTER — Other Ambulatory Visit: Payer: 59

## 2022-04-12 MED ORDER — NONFORMULARY OR COMPOUNDED ITEM: 6 refills | Status: DC

## 2022-04-12 NOTE — Telephone Encounter (Signed)
Left message to call.

## 2022-04-12 NOTE — Telephone Encounter (Signed)
Spoke with patient. She lives in [place] and I located [facility] in [place] and spoke with pharmacist and provided Rx. I reviewed the directions with the patient.  Before we hung up she asked me to ask Dr. Talbert Nan a question. She sees Hematologist and they have recommended she continue with more iron infusions. She wondered if Dr. Talbert Nan thought the iron infusions were to blame for her bleeding problems she has had lately and if Dr. Talbert Nan thought she should receive the recommended iron infusions.

## 2022-04-12 NOTE — Telephone Encounter (Signed)
Spoke with patient and informed her. °

## 2022-04-12 NOTE — Telephone Encounter (Signed)
Her heavy bleeding is what causes the anemia and the need for iron transfusions.

## 2022-04-12 NOTE — Telephone Encounter (Signed)
Please call the compounding pharmacy, inform them that she had an allergic reaction to norethindrone and ask them to compound micronized progesterone, 200 mg, 1 tablet po qhs x 12 days every month if no spontaneous menses. #12, 6 refills

## 2022-04-19 ENCOUNTER — Other Ambulatory Visit: Payer: Self-pay

## 2022-04-19 ENCOUNTER — Telehealth: Payer: Self-pay

## 2022-04-19 NOTE — Telephone Encounter (Signed)
Patient was prescribed "compound micronized progesterone, 200 mg, 1 tablet po qhs x 12 days every month if no spontaneous menses. "  Patient called today to ask when should she take the Rx?  She said her period was supposed to have started this past Monday. She questioned how long does she wait on it or how to decide when to take the medication?

## 2022-04-19 NOTE — Telephone Encounter (Signed)
To keep it easy, I would have her take it on the 1-12 of the month if she doesn't have a spontaneous cycle on her own.

## 2022-04-19 NOTE — Telephone Encounter (Signed)
Patient had some questions and some confusion about taking the progesterone.  She understands that she has waited 4 days on her menses this month and will start tomorrow and take Days 1-12 of June.  She asked if you expect taking this progesterone will make her bleed and is so when to expect that?  What If no bleed?  Also, she asked what to do next month if period not yet late at July 1-12?

## 2022-04-21 NOTE — Telephone Encounter (Signed)
Typically a withdrawal bleed will occur within one to two weeks of finishing the progesterone. She will only bleed if there has been hormonal stimulation of the lining of the uterus. At some point her ovaries will stop making hormones and she won't bleed after taking the progesterone. I'm just trying to prevent overgrowth of the lining of her uterus and a prolonged anovulatory bleed  I think she can take the progesterone every month if she doesn't have a cycle. For example if she gets a w/d bleed from the progesterone this month (at the end of June), she can wait until 8/1 to take the medication again. If she also has a cycle in July, she can wait until 9/1 to take the medication. She only takes it if she doesn't have a cycle.  If this still doesn't make sense, please have her come in so I can explain it in person. I can draw pictures for her to help explain it.

## 2022-04-27 NOTE — Telephone Encounter (Signed)
I would just finish it.

## 2022-04-27 NOTE — Telephone Encounter (Signed)
Patient informed. 

## 2022-04-27 NOTE — Telephone Encounter (Signed)
Patient informed with below note. Patient said took 1st pill of progesterone day 1-12 on 04/20/22 and started bleeding 2 days ago.  She wanted to know if she should complete the remaining dose to complete day 1-12 dose?  Please advise

## 2022-09-25 IMAGING — DX DG CHEST 1V PORT
1 series · 1 of 1 positions shown · non-contrast
Comparison: None.

CLINICAL DATA: Shortness of breath

EXAM:
PORTABLE CHEST 1 VIEW

[chest ap]
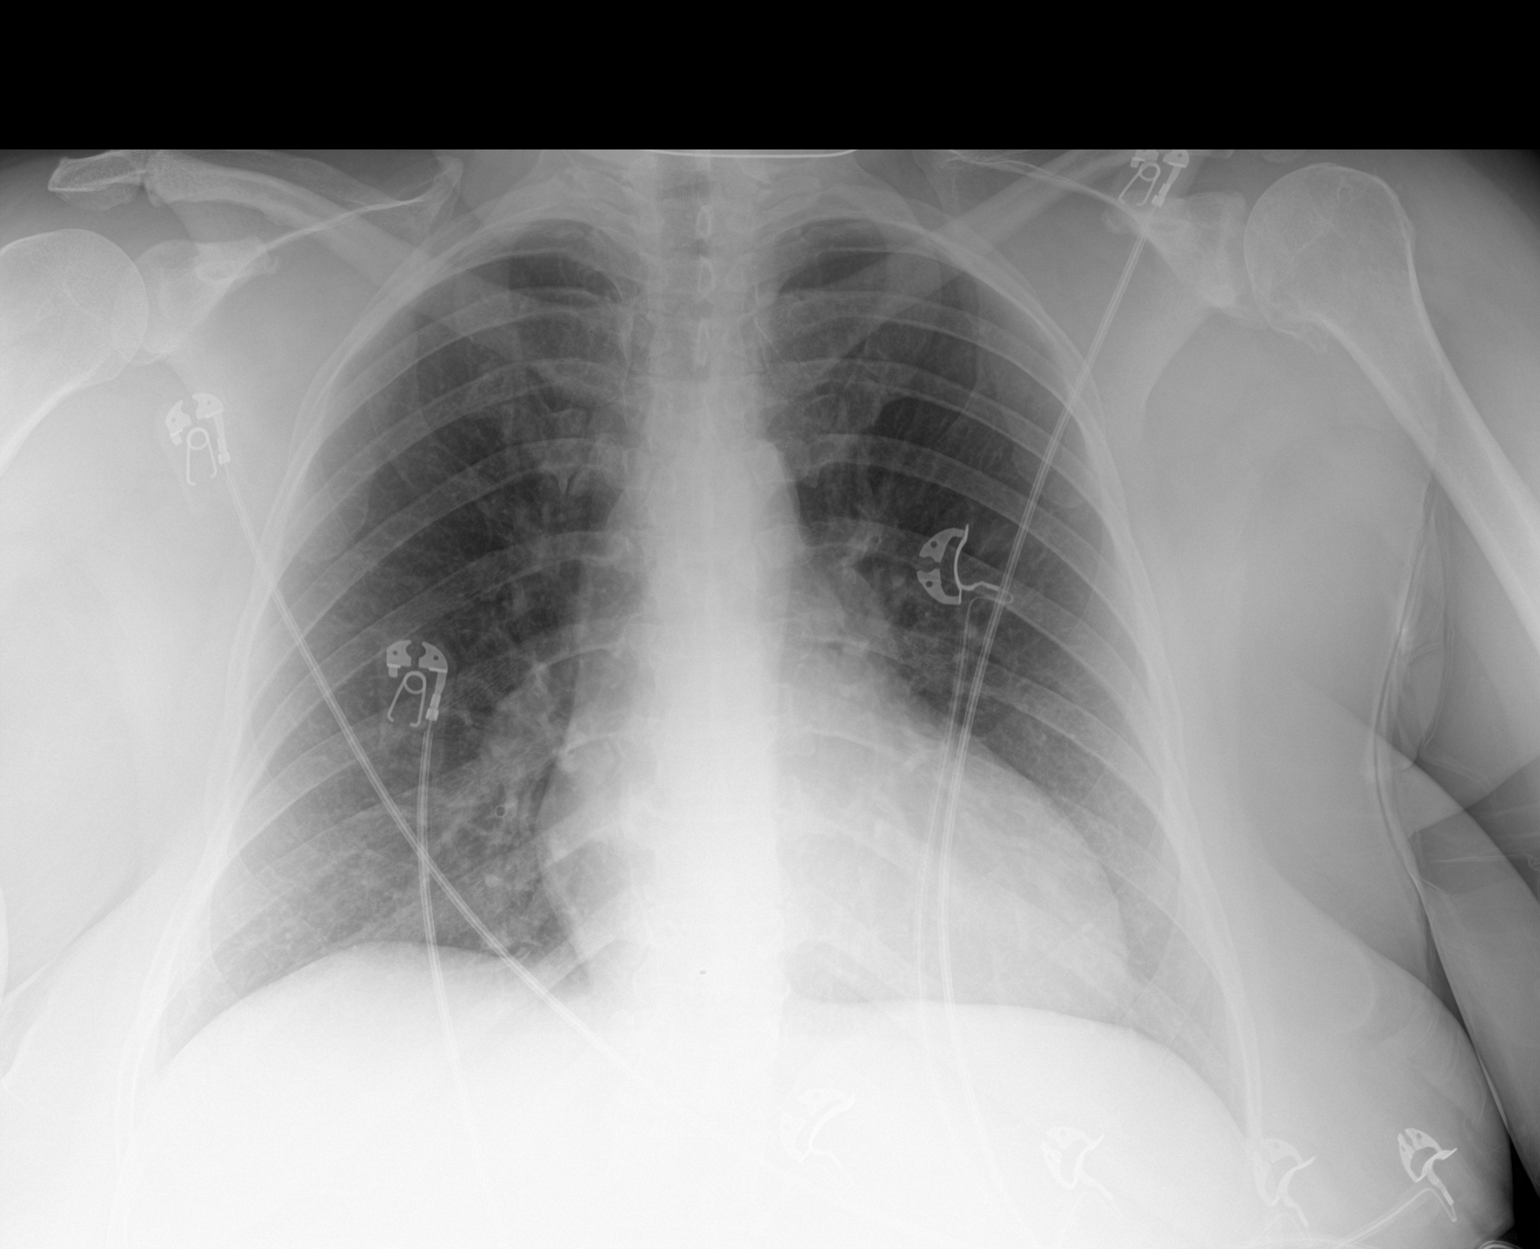

[1 of 1 positions shown; findings below may reference images not displayed]

FINDINGS: Normal heart size for portable AP technique.The cardiomediastinal
contours are normal. The lungs are clear. Pulmonary vasculature is
normal. No consolidation, pleural effusion, or pneumothorax. No
acute osseous abnormalities are seen.
IMPRESSION: No acute chest findings.

## 2022-09-25 IMAGING — CT CT HEAD W/O CM
3 series · 15 of 46 positions shown, 18 images · non-contrast
Comparison: None.

CLINICAL DATA: Head trauma, abnormal mental status (Age 18-64y)



[Series 2: head wo · axial · 0.47mm/px · z∈[-130,-10]mm · 9 of 29 slices shown, 12 images]
[im 3/29  brain]
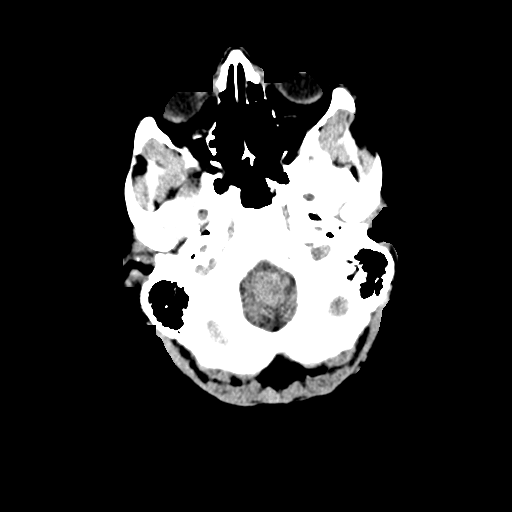
[im 3/29  bone]
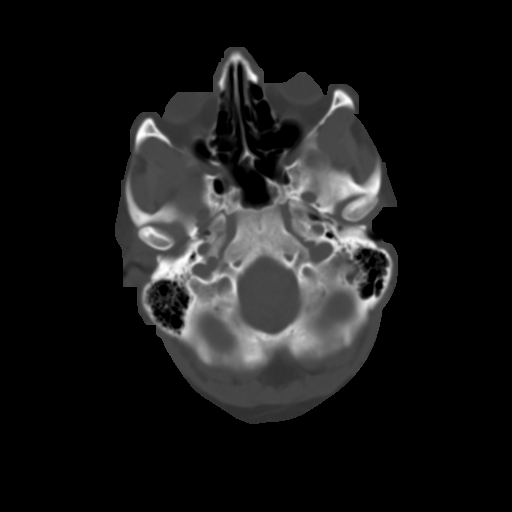
[im 6/29  brain]
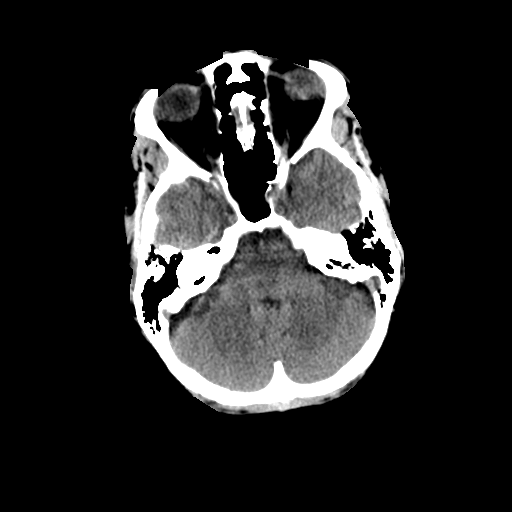
[im 9/29  brain]
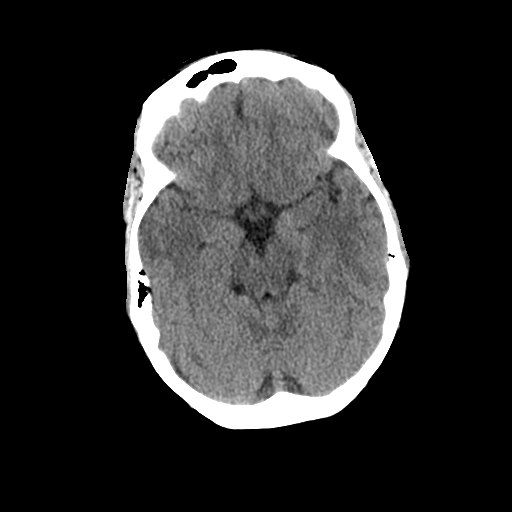
[im 12/29  brain]
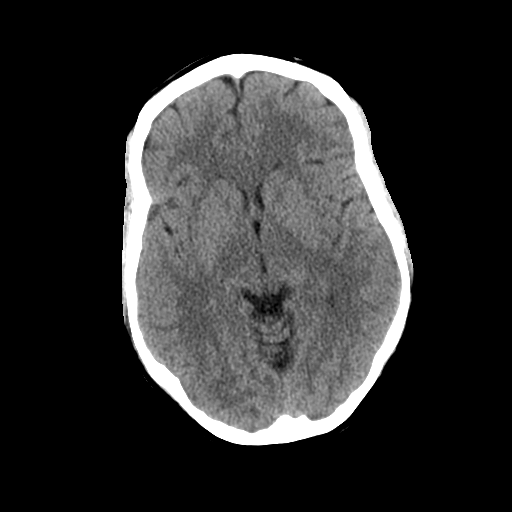
[im 15/29  brain]
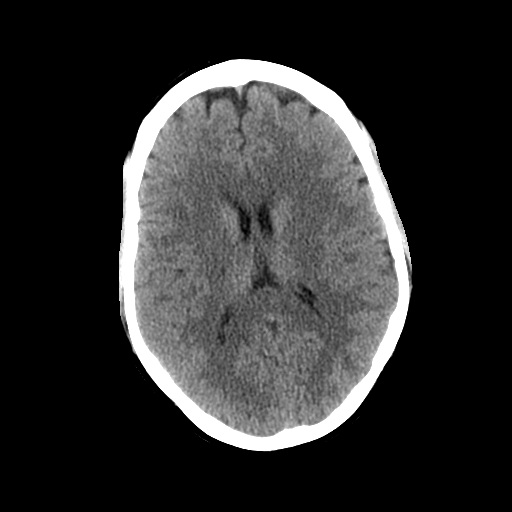
[im 15/29  bone]
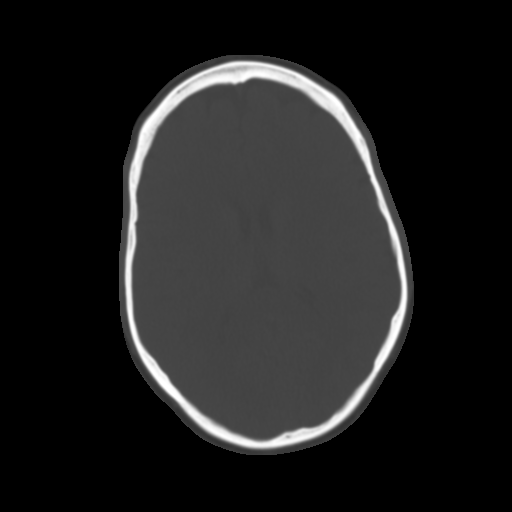
[im 18/29  brain]
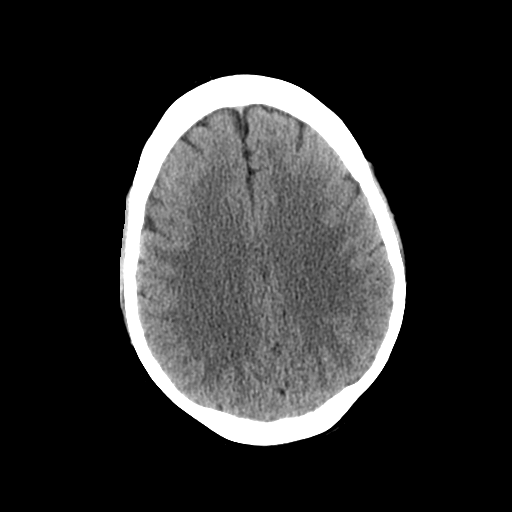
[im 21/29  brain]
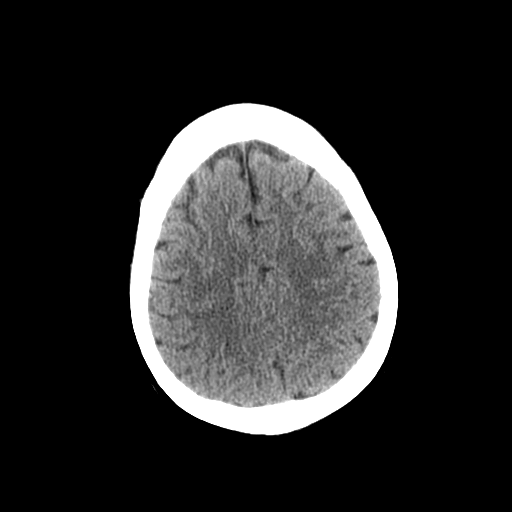
[im 24/29  brain]
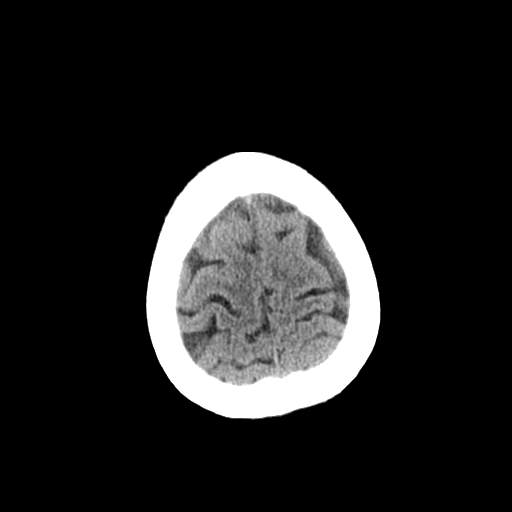
[im 27/29  brain]
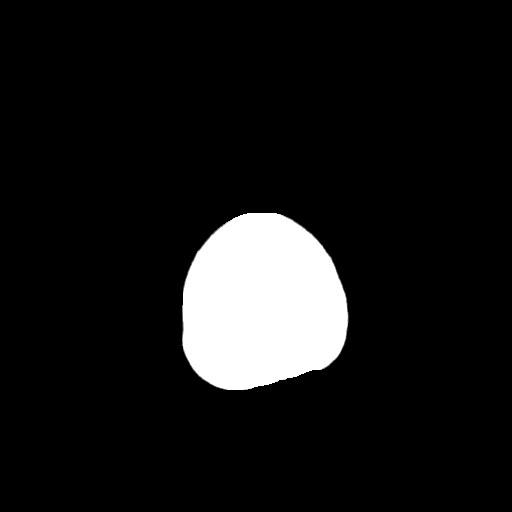
[im 27/29  bone]
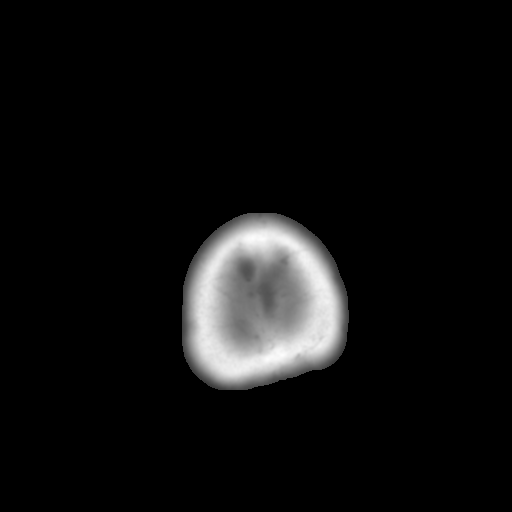

[Series 4: coronal soft tissue · coronal · 0.31mm/px · 3 of 84 slices shown]
[im 28/84  brain]
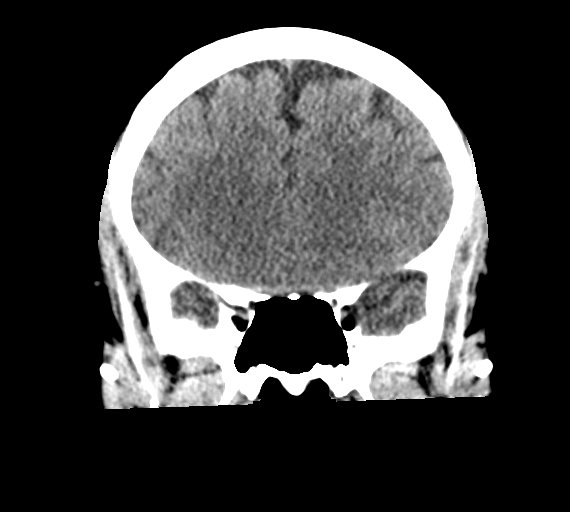
[im 37/84  brain]
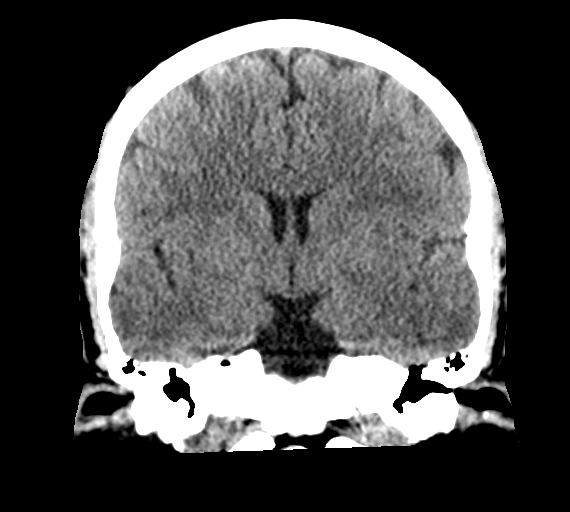
[im 47/84  brain]
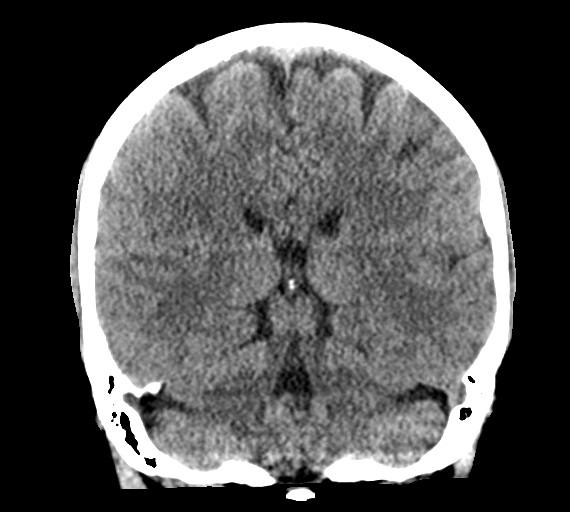

[Series 5: sagittal soft tissue · sagittal · 0.31mm/px · 3 of 60 slices shown]
[im 20/60  brain]
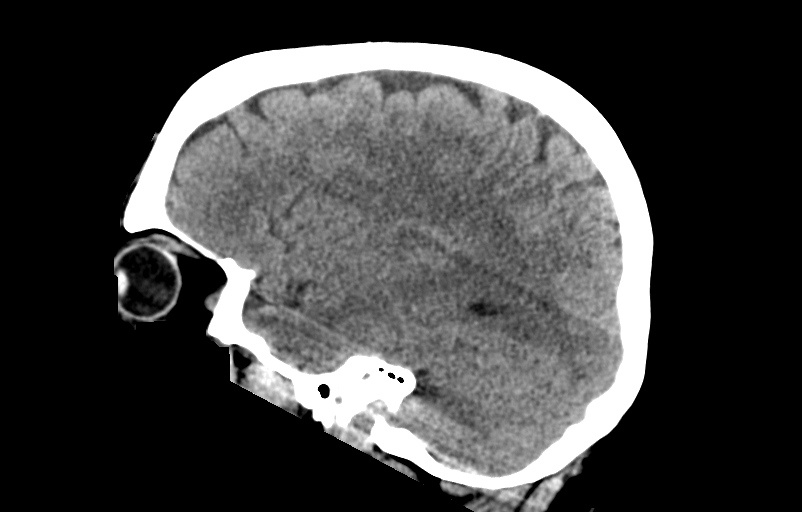
[im 30/60  brain]
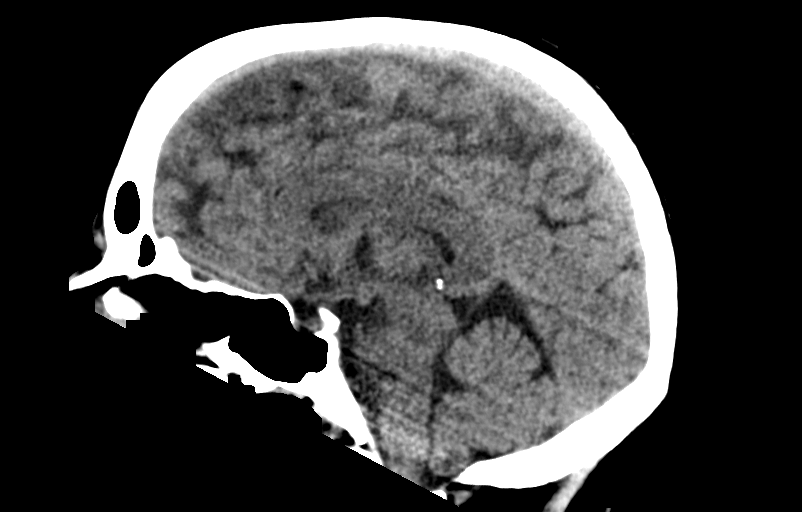
[im 40/60  brain]
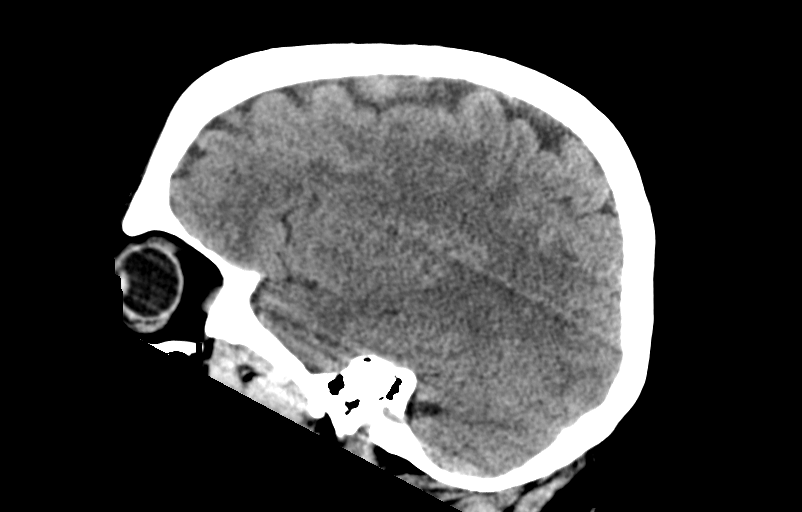

[15 of 46 positions shown; findings below may reference images not displayed]

FINDINGS: Brain: No evidence of acute infarction, hemorrhage, hydrocephalus,
extra-axial collection or mass lesion/mass effect.

Vascular: No hyperdense vessel identified.

Skull: No acute fracture.

Sinuses/Orbits: Clear sinuses.  No acute orbital findings.

Other: No mastoid effusions.
IMPRESSION: No evidence of acute intracranial abnormality.

## 2022-12-08 ENCOUNTER — Telehealth: Payer: Self-pay | Admitting: *Deleted

## 2022-12-08 NOTE — Telephone Encounter (Signed)
Patient has open order for U guided IUD insertion. Not scheduled to date. No future AEX scheduled. Ordered 03/07/22.   Ok to cancel order and close encounter?

## 2022-12-13 NOTE — Telephone Encounter (Signed)
Yes, please cancel the order and close the encounter.

## 2022-12-13 NOTE — Telephone Encounter (Signed)
Orders cancelled. Encounter closed.

## 2022-12-20 ENCOUNTER — Other Ambulatory Visit: Payer: Self-pay

## 2022-12-20 NOTE — Telephone Encounter (Addendum)
Last AEX 10/31/2019.  Last visit was video visit 03/07/2022.

## 2022-12-20 NOTE — Telephone Encounter (Signed)
You can call in a one month supply, but please set her up for an annual exam.

## 2022-12-26 NOTE — Telephone Encounter (Addendum)
AEX is scheduled 02/06/23.  [place] [place] for two refills?  (First available AEX appt was 02/01/23.She could not come that day. Nothing left in Feb.)

## 2022-12-26 NOTE — Telephone Encounter (Signed)
yes

## 2022-12-27 NOTE — Telephone Encounter (Signed)
Called 2 refills into [facility].

## 2023-01-25 NOTE — Progress Notes (Signed)
52 y.o. G61P3003 Married White or Caucasian Not Hispanic or Latino female here for annual exam.   Cycles range from every 18 to 59 days. Typically bleeds for 7 days, occasional can spot for an additional 1-2 days. 2-3 days of every cycle she saturates an ultra tampon and a pad in up to 30 minutes. She has been getting iron transfusions, last in 1/24 She has been taking micronized progesterone 200 mg for 12 days a month. Typically bleeds about a week later, sometimes she doesn't bleed after the progesterone.  This is tolerable for her. She had a benign biopsy of the endometrium in 4/23.  No significant vasomotor symptoms.  Period Duration (Days): 7 Period Pattern: (!) Irregular Menstrual Flow: Heavy Menstrual Control: Hospital pad, Tampon Menstrual Control Change Freq (Hours): 0.5 Dysmenorrhea: None  She has an allergy to norethindrone.   02/01/23 Hgb 14.1 gm/dl.   No dyspareunia. No bowel or bladder c/o.   Patient's last menstrual period was 01/12/2023.          Sexually active: Yes.    The current method of family planning is vasectomy  Exercising: Yes.     walking Smoker:  no  Health Maintenance: Pap:  10/31/19 Wnl Hr HPV Neg  History of abnormal Pap:  no  MMG:  07/29/18 bi-rads 1 neg patient aware she is due  BMD:   n/a Colonoscopy: 04/23/20 normal. Has UC, f/u due in 5 years.  TDaP:  10/30/17 Gardasil: n/a   reports that she has never smoked. She has never used smokeless tobacco. She reports current alcohol use. She reports that she does not use drugs. Rare ETOH. Homemaker, has 3 grown kids. 52 year old granddaughter.   Past Medical History:  Diagnosis Date   Allergies    Fibromyalgia    Hypertension    PTSD (post-traumatic stress disorder)    Rheumatoid arthritis ([place])    Ulcerative colitis ([place])     Past Surgical History:  Procedure Laterality Date   CESAREAN SECTION     2   TONSILLECTOMY      Current Outpatient Medications  Medication Sig Dispense Refill    busPIRone (BUSPAR) 10 MG tablet Take 1 tablet by mouth as needed.     Cholecalciferol (VITAMIN D3) 250 MCG (10000 UT) capsule      Cyanocobalamin (VITAMIN B 12) 500 MCG TABS      diclofenac Sodium (VOLTAREN) 1 % GEL      Diindolylmethane POWD      DULoxetine (CYMBALTA) 60 MG capsule Take 60 mg by mouth every morning.     famotidine (PEPCID) 40 MG tablet      fluticasone (FLONASE) 50 MCG/ACT nasal spray      lisinopril (ZESTRIL) 10 MG tablet      Magnesium 200 MG TABS      mesalamine (LIALDA) 1.2 g EC tablet Take 1 tablet by mouth daily. Take 4 tablets daily.     metoprolol succinate (TOPROL-XL) 25 MG 24 hr tablet      montelukast (SINGULAIR) 10 MG tablet Take 10 mg by mouth daily.     montelukast (SINGULAIR) 10 MG tablet      Multiple Vitamins-Minerals (ONE-A-DAY 50 PLUS PO)      NONFORMULARY OR COMPOUNDED ITEM Compound micronized progesterone 200 mg.  S: take one tab po qhs 12 days every month is no spontaneous menses. 12 each 6   Omega-3 1000 MG CAPS      omeprazole (PRILOSEC) 40 MG capsule TAKE 1 CAPSULE(40 MG) BY MOUTH  DAILY     predniSONE (DELTASONE) 5 MG tablet 5 mg daily with breakfast.     Sarilumab (KEVZARA) 200 MG/1.14ML SOAJ      traZODone (DESYREL) 50 MG tablet Take 50 mg by mouth at bedtime.     Turmeric 500 MG CAPS      valACYclovir (VALTREX) 500 MG tablet TAKE 1 TABLET(500 MG) BY MOUTH DAILY     albuterol (VENTOLIN HFA) 108 (90 Base) MCG/ACT inhaler Inhale 1 puff into the lungs every 4 (four) hours as needed.     mesalamine (LIALDA) 1.2 g EC tablet Take by mouth.     No current facility-administered medications for this visit.    Family History  Problem Relation Age of Onset   Polycystic kidney disease Mother    Hypertension Mother    Hypertension Maternal Grandmother    Hypertension Maternal Grandfather     Review of Systems  All other systems reviewed and are negative.   Exam:   BP 128/80   Pulse 78   Ht 5\' 3"  (1.6 m)   Wt 232 lb (105.2 kg)   LMP  01/12/2023   SpO2 100%   BMI 41.10 kg/m   Weight change: @WEIGHTCHANGE @ Height:   Height: 5\' 3"  (160 cm)  Ht Readings from Last 3 Encounters:  02/06/23 5\' 3"  (1.6 m)  03/02/22 5\' 3"  (1.6 m)  12/09/19 5\' 2"  (1.575 m)    General appearance: alert, cooperative and appears stated age Head: Normocephalic, without obvious abnormality, atraumatic Neck: no adenopathy, supple, symmetrical, trachea midline and thyroid normal to inspection and palpation Lungs: clear to auscultation bilaterally Cardiovascular: regular rate and rhythm Breasts: normal appearance, no masses or tenderness Abdomen: soft, non-tender; non distended,  no masses,  no organomegaly Extremities: extremities normal, atraumatic, no cyanosis or edema Skin: Skin color, texture, turgor normal. No rashes or lesions Lymph nodes: Cervical, supraclavicular, and axillary nodes normal. No abnormal inguinal nodes palpated Neurologic: Grossly normal   Pelvic: External genitalia:  no lesions              Urethra:  normal appearing urethra with no masses, tenderness or lesions              Bartholins and Skenes: normal                 Vagina: normal appearing vagina with normal color and discharge, no lesions              Cervix: no lesions               Bimanual Exam:  Uterus:   no masses or tenderness              Adnexa: no mass, fullness, tenderness               Rectovaginal: Confirms               Anus:  normal sphincter tone, no lesions  Gae Dry, CMA chaperoned for the exam.  1. Well woman exam Discussed breast self exam Discussed calcium and vit D intake Mammogram overdue, she will schedule Colonoscopy UTD Labs with primary and other specialists  2. Screening for cervical cancer - Cytology - PAP -She is immunosuppressed   3. Menorrhagia with irregular cycle Prior negative w/u, tolerable to her with cyclic progesterone  4. History of anemia Followed by Hematology, intermittent iron transfusions Can't take  oral iron.

## 2023-02-01 DIAGNOSIS — Z79899 Other long term (current) drug therapy: Secondary | ICD-10-CM | POA: Insufficient documentation

## 2023-02-06 ENCOUNTER — Ambulatory Visit (INDEPENDENT_AMBULATORY_CARE_PROVIDER_SITE_OTHER): Payer: 59 | Admitting: Obstetrics and Gynecology

## 2023-02-06 ENCOUNTER — Encounter: Payer: Self-pay | Admitting: Obstetrics and Gynecology

## 2023-02-06 ENCOUNTER — Telehealth: Payer: Self-pay

## 2023-02-06 ENCOUNTER — Other Ambulatory Visit (HOSPITAL_COMMUNITY)
Admission: RE | Admit: 2023-02-06 | Discharge: 2023-02-06 | Disposition: A | Discharge status: Home / Self Care | Payer: 59 | Source: Ambulatory Visit | Attending: Obstetrics and Gynecology | Admitting: Obstetrics and Gynecology

## 2023-02-06 ENCOUNTER — Other Ambulatory Visit: Payer: Self-pay

## 2023-02-06 VITALS — BP 128/80 | HR 78 | Ht 63.0 in | Wt 232.0 lb

## 2023-02-06 DIAGNOSIS — N921 Excessive and frequent menstruation with irregular cycle: Secondary | ICD-10-CM | POA: Diagnosis not present

## 2023-02-06 DIAGNOSIS — Z01419 Encounter for gynecological examination (general) (routine) without abnormal findings: Secondary | ICD-10-CM | POA: Diagnosis not present

## 2023-02-06 DIAGNOSIS — Z124 Encounter for screening for malignant neoplasm of cervix: Secondary | ICD-10-CM

## 2023-02-06 DIAGNOSIS — Z862 Personal history of diseases of the blood and blood-forming organs and certain disorders involving the immune mechanism: Secondary | ICD-10-CM | POA: Diagnosis not present

## 2023-02-06 MED ORDER — NONFORMULARY OR COMPOUNDED ITEM: 3 refills | Status: AC

## 2023-02-06 NOTE — Telephone Encounter (Signed)
Tara Dom, MD  [facility] Triage Please refill her compounded progesterone for another year. Micronized progesterone 200 mg, take one capsule a day from days 1-12 of the month. #36, 3 refills.

## 2023-02-06 NOTE — Patient Instructions (Signed)

## 2023-02-06 NOTE — Telephone Encounter (Signed)
I spoke with patient and confirmed pharmacy. Rx called in to [facility].

## 2023-02-08 LAB — CYTOLOGY - PAP
Adequacy: ABSENT
Comment: NEGATIVE
Diagnosis: NEGATIVE
High risk HPV: NEGATIVE

## 2024-02-07 ENCOUNTER — Ambulatory Visit: Payer: 59 | Admitting: Obstetrics and Gynecology

## 2024-02-12 ENCOUNTER — Encounter: Payer: Self-pay | Admitting: Radiology

## 2024-02-12 ENCOUNTER — Ambulatory Visit (INDEPENDENT_AMBULATORY_CARE_PROVIDER_SITE_OTHER): Payer: 59 | Admitting: Radiology

## 2024-02-12 VITALS — BP 136/76 | HR 88 | Ht 63.0 in | Wt 251.4 lb

## 2024-02-12 DIAGNOSIS — Z1331 Encounter for screening for depression: Secondary | ICD-10-CM | POA: Diagnosis not present

## 2024-02-12 DIAGNOSIS — Z01419 Encounter for gynecological examination (general) (routine) without abnormal findings: Secondary | ICD-10-CM | POA: Diagnosis not present

## 2024-02-12 DIAGNOSIS — N951 Menopausal and female climacteric states: Secondary | ICD-10-CM

## 2024-02-12 MED ORDER — NONFORMULARY OR COMPOUNDED ITEM: 4 refills | Status: AC

## 2024-02-12 NOTE — Progress Notes (Signed)
 Tara Owen [birth year] [id number]   History:  53 y.o. G3P3 presents for annual exam. LMP: 01/29/24, PMP: 4 months prior, taking progesterone (compounded) 12 days a month. C/o trouble losing weight. Has gained 20lbs since her visit last year at this time.  Gynecologic History Patient's last menstrual period was 01/29/2024. Period Duration (Days): 7 Period Pattern: (!) Irregular Menstrual Flow: Moderate Menstrual Control: Thin pad, Maxi pad, Tampon Dysmenorrhea: (!) Mild (back cramps, legs swell) Contraception/Family planning: vasectomy Sexually active: yes Last Pap: 2024. Results were: normal Last mammogram: 2021. Results were: normal  Obstetric History OB History  Gravida Para Term Preterm AB Living  3 3 3   3   SAB IAB Ectopic Multiple Live Births      2    # Outcome Date GA Lbr Len/2nd Weight Sex Type Anes PTL Lv  3 Term      CS-Unspec   LIV  2 Term      CS-Unspec     1 Term      Vag-Spont   LIV       02/12/2024    3:41 PM  Depression screen PHQ 2/9  Decreased Interest 0  Down, Depressed, Hopeless 0  PHQ - 2 Score 0     The following portions of the patient's history were reviewed and updated as appropriate: allergies, current medications, past family history, past medical history, past social history, past surgical history, and problem list.  Review of Systems  All other systems reviewed and are negative.   Past medical history, past surgical history, family history and social history were all reviewed and documented in the EPIC chart.  Exam:  Vitals:   02/12/24 1539  BP: 136/76  Pulse: 88  SpO2: 96%  Weight: 251 lb 6.4 oz (114 kg)  Height: 5\' 3"  (1.6 m)   Body mass index is 44.53 kg/m.  Physical Exam Vitals and nursing note reviewed. Exam conducted with a chaperone present.  Constitutional:      Appearance: Normal appearance. She is morbidly obese.  HENT:     Head: Normocephalic and atraumatic.  Neck:     Thyroid: No thyroid mass, thyromegaly  or thyroid tenderness.  Cardiovascular:     Rate and Rhythm: Regular rhythm.     Heart sounds: Normal heart sounds.  Pulmonary:     Effort: Pulmonary effort is normal.     Breath sounds: Normal breath sounds.  Chest:  Breasts:    Breasts are symmetrical.     Right: Normal. No inverted nipple, mass, nipple discharge, skin change or tenderness.     Left: Normal. No inverted nipple, mass, nipple discharge, skin change or tenderness.  Abdominal:     General: Abdomen is flat. Bowel sounds are normal.     Palpations: Abdomen is soft.  Genitourinary:    General: Normal vulva.     Vagina: Normal. No vaginal discharge, bleeding or lesions.     Cervix: Normal. No discharge or lesion.     Uterus: Normal. Not enlarged and not tender.      Adnexa: Right adnexa normal and left adnexa normal.       Right: No mass, tenderness or fullness.         Left: No mass, tenderness or fullness.    Lymphadenopathy:     Upper Body:     Right upper body: No axillary adenopathy.     Left upper body: No axillary adenopathy.  Skin:    General: Skin is warm and dry.  Neurological:  Mental Status: She is alert and oriented to person, place, and time.  Psychiatric:        Mood and Affect: Mood normal.        Thought Content: Thought content normal.        Judgment: Judgment normal.     Raynelle Fanning, CMA present for exam  Assessment/Plan:   1. Well woman exam with routine gynecological exam (Primary) Pap up to date Mammogram overdue, needs to schedule  2. Perimenopausal Rx called in to [facility] pharmacy by Raynelle Fanning, CMA for compounded progesterone 100mg  #90 with 4 refills to take daily.  Discussed the importance of endometrial protection.  Discuss referral to healthy weight and wellness with PCP, has been to [facility] weight loss program already  Return in about 1 year (around 02/11/2025) for Annual.  Arlie Solomons B WHNP-BC 4:08 PM 02/12/2024

## 2024-02-13 ENCOUNTER — Telehealth: Payer: Self-pay

## 2024-02-13 NOTE — Telephone Encounter (Signed)
 [facility]) LM on RF line:  regarding compounded progesterone 100 mg and if it should be compounded progesterone 100 mg SR.  Per Jami:  RX should be compounded progesterone 100 mg, not SR.  VM left for pharmacy with above information.

## 2024-02-14 ENCOUNTER — Telehealth: Payer: Self-pay

## 2024-02-14 NOTE — Telephone Encounter (Signed)
 [facility] pharmacy called back regarding compounded progesterone 100 mg.  Pharmacist states compounded progesterone has to be compounded in SR formulation.  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  Please advise if ok for compounded progesterone 100 mg SR.

## 2024-02-14 NOTE — Telephone Encounter (Signed)
 VM left for [facility] @ [phone] with authorization for compounded progesterone 100 mg SR.  Pharmacy was instructed to call back if they had any questions.

## 2024-02-14 NOTE — Telephone Encounter (Signed)
 Ok for [employer]
